# Patient Record
Sex: Female | Born: 1990 | Race: White | Hispanic: No | Marital: Single | Smoking: Never smoker
Health system: Southern US, Community
[De-identification: ages and names within clinical notes are randomized; demographics above are authoritative.]

## PROBLEM LIST (undated history)

## (undated) DIAGNOSIS — Z01419 Encounter for gynecological examination (general) (routine) without abnormal findings: Secondary | ICD-10-CM

## (undated) DIAGNOSIS — F909 Attention-deficit hyperactivity disorder, unspecified type: Secondary | ICD-10-CM

## (undated) DIAGNOSIS — I8390 Asymptomatic varicose veins of unspecified lower extremity: Secondary | ICD-10-CM

## (undated) HISTORY — DX: Attention-deficit hyperactivity disorder, unspecified type: F90.9

## (undated) HISTORY — DX: Encounter for gynecological examination (general) (routine) without abnormal findings: Z01.419

## (undated) HISTORY — PX: WISDOM TOOTH EXTRACTION: SHX21

## (undated) HISTORY — DX: Asymptomatic varicose veins of unspecified lower extremity: I83.90

---
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
## 2010-03-19 NOTE — Assessment & Plan Note (Signed)
Summary: check leg//ccm   Vital Signs:  Patient profile:   20 year old female Weight:      127 pounds BMI:     19.67 Temp:     98.2 degrees F oral BP sitting:   100 / 66  (left arm) Cuff size:   regular  Vitals Entered By: Raechel Ache, RN (Jun 28, 2009 11:10 AM) CC: Check vein L leg and c/o sometimes toes curl under on L foot.   History of Present Illness: Here with her mother to look at some painful varicose veins in her left lower leg. These came up about a year ago, and they have slowly enlarged since then. The right leg is not affected. Up until a year ago she played a lot of tennis almost daily, but she has done little exercising lately. 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 The left leg will swell a bit and get painful at times, especially if she is up on her feet all day. She gets frequent foot cramps in the left foot also, and not the right.   Allergies: No Known Drug Allergies  Past History:  Past Medical History: Reviewed history from 08/09/2008 and no changes required. Soft teeth ADHD, has had testing with Dr. Eliott Nine allergic diathesis Toe and leg cramps sees Dr. Edward Jolly for GYN exams  Past Surgical History: Reviewed history from 08/09/2008 and no changes required. No surgeries  Review of Systems  The patient denies anorexia, fever, weight loss, weight gain, vision loss, decreased hearing, hoarseness, chest pain, syncope, dyspnea on exertion, prolonged cough, headaches, hemoptysis, abdominal pain, melena, hematochezia, severe indigestion/heartburn, hematuria, incontinence, genital sores, muscle weakness, suspicious skin lesions, transient blindness, difficulty walking, depression, unusual weight change, abnormal bleeding, enlarged lymph nodes, angioedema, breast masses, and testicular masses.    Physical Exam  General:  Well-developed,well-nourished,in no acute distress; alert,appropriate and  cooperative throughout examination Pulses:  foot pulses are full and symmetrical Extremities:  no edema. The left lower leg has a number of varicosities  which are a little tender. Not red or warm. No calf tenderness, negative Homans, no cords felt.   Impression & Recommendations:  Problem # 1:  VARICOSE VEINS LOWER EXTREMITIES W/INFLAMMATION (ICD-454.1)  Orders: Misc. Referral (Misc. Ref)  Complete Medication List: 1)  Concerta 27 Mg Cr-tabs (Methylphenidate hcl) .... Once daily, may fill on 06-06-09 2)  Loestrin 24 Fe 1-20 Mg-mcg Tabs (Norethin ace-eth estrad-fe) .... Once daily 3)  Fluticasone Propionate 50 Mcg/act Susp (Fluticasone propionate) .... 2 sprays per nostril once daily  Patient Instructions: 1)  Advised her to wear support hose on the leg. We will refer her to see the [facility] soon for further evaluation.

## 2010-03-19 NOTE — Progress Notes (Signed)
Summary: concerta issues  Phone Note Call from Patient Call back at [phone]   Caller: Mom---voice mail Summary of Call: Wants to speak with Traci Simpson about the Concerta rx. Initial call taken by: Warnell Forester,  December 04, 2009 1:51 PM  Follow-up for Phone Call        states has to mail these rx in monthly cause [facility] wont [facility] her [facility] all in at one time. advised her since they are written montly call [facility] and see why they can't take rx at one time.  Follow-up by: Pura Spice, RN,  December 04, 2009 3:31 PM  Additional Follow-up for Phone Call Additional follow up Details #1::        mom called [facility] and mom wants you to return call for instructions. call mom @ [phone] or [phone]. Additional Follow-up by: Warnell Forester,  December 04, 2009 4:07 PM    Additional Follow-up for Phone Call Additional follow up Details #2::    mom called back and states for 90 day supply for stratrera will need PA and she will call and have form faxed over.  Follow-up by: Pura Spice, RN,  December 04, 2009 4:33 PM   Appended Document: concerta issues mom walked in and said [facility] will not fax over PA form but she wants 90 day supply of concerta. she has the other rx.   Appended Document: concerta issues  clarificataion above mess said on Oct 18 at 4:33pm stratretra but med is concerta....gh rn...........Marland Kitchen  Appended Document: concerta issues we got a PA form for Methyphenidate today. This was filled out and faxed back in

## 2010-03-19 NOTE — Letter (Signed)
Summary: Letter from mom regarding Rx.  Letter from mom regarding Rx.   Imported By: Maryln Gottron 03/09/2009 14:34:29  _____________________________________________________________________  External Attachment:    Type:   Image     Comment:   External Document

## 2010-03-19 NOTE — Letter (Signed)
Summary: [facility]  [facility]   Imported By: Maryln Gottron 10/03/2009 15:30:45  _____________________________________________________________________  External Attachment:    Type:   Image     Comment:   External Document

## 2010-03-19 NOTE — Letter (Signed)
Summary: [facility]  [facility]   Imported By: Maryln Gottron 08/13/2009 13:15:30  _____________________________________________________________________  External Attachment:    Type:   Image     Comment:   External Document

## 2010-03-19 NOTE — Progress Notes (Signed)
Summary: REFILL REQUEST (Adderall - PA approved?)  Phone Note Refill Request Message from:  Patient on December 06, 2009 4:22 PM  Refills Requested: Medication #1:  CONCERTA 27 MG CR-TABS once daily   Notes: 90-DAY SUPPLY (plus one refill) so [facility].... Pts mom can be reached at [phone] when Rx is ready for p/u ---- may not be able to p/u till Monday, 10/24.  90-DAY SUPPLY (plus one refill) so it can be sent [facility] by pts mom.... Pts mom can be reached at [phone] when Rx is ready for p/u. Original Rx's were left with [facility] per pts mom.   Initial call taken by: Debbra Riding,  December 06, 2009 4:24 PM  Follow-up for Phone Call        I left message on mom's voicemail yesterday stating that the prior auth was approved. where are the rxs? please call mom. Follow-up by: Warnell Forester,  December 07, 2009 8:07 AM  Additional Follow-up for Phone Call Additional follow up Details #1::        these are ready to be picked up  Additional Follow-up by: Nelwyn Salisbury MD,  December 07, 2009 8:54 AM    New/Updated Medications: CONCERTA 27 MG CR-TABS (METHYLPHENIDATE HCL) once daily CONCERTA 27 MG CR-TABS (METHYLPHENIDATE HCL) once daily, may fill on 03-08-10 Prescriptions: CONCERTA 27 MG CR-TABS (METHYLPHENIDATE HCL) once daily, may fill on 03-08-10  #90 x 0   Entered and Authorized by:   Nelwyn Salisbury MD   Signed by:   Nelwyn Salisbury MD on 12/07/2009   Method used:   Print then Give to Patient   RxID:   [id number] CONCERTA 27 MG CR-TABS (METHYLPHENIDATE HCL) once daily  #90 x 0   Entered and Authorized by:   Nelwyn Salisbury MD   Signed by:   Nelwyn Salisbury MD on 12/07/2009   Method used:   Print then Give to Patient   RxID:   [id number]

## 2010-03-19 NOTE — Medication Information (Signed)
Summary: Methylphenidate Approved  Methylphenidate Approved   Imported By: Maryln Gottron 12/10/2009 14:38:00  _____________________________________________________________________  External Attachment:    Type:   Image     Comment:   External Document

## 2010-05-05 LAB — DIFFERENTIAL
Basophils Absolute: 0 10*3/uL (ref 0.0–0.1)
Basophils Relative: 1 % (ref 0–1)
Lymphocytes Relative: 48 % — ABNORMAL HIGH (ref 12–46)
Neutro Abs: 2.6 10*3/uL (ref 1.7–7.7)
Neutrophils Relative %: 42 % — ABNORMAL LOW (ref 43–77)

## 2010-05-05 LAB — CBC
Hemoglobin: 14.4 g/dL (ref 12.0–15.0)
MCHC: 34.4 g/dL (ref 30.0–36.0)
Platelets: 215 10*3/uL (ref 150–400)
RDW: 11.9 % (ref 11.5–15.5)

## 2010-05-05 LAB — POCT INFECTIOUS MONO SCREEN: Mono Screen: NEGATIVE

## 2010-05-23 ENCOUNTER — Other Ambulatory Visit (INDEPENDENT_AMBULATORY_CARE_PROVIDER_SITE_OTHER): Payer: BC Managed Care – PPO | Admitting: Family Medicine

## 2010-05-23 DIAGNOSIS — Z0000 Encounter for general adult medical examination without abnormal findings: Secondary | ICD-10-CM

## 2010-05-23 LAB — HEPATIC FUNCTION PANEL
ALT: 15 U/L (ref 0–35)
AST: 18 U/L (ref 0–37)
Bilirubin, Direct: 0.3 mg/dL (ref 0.0–0.3)
Total Bilirubin: 2 mg/dL — ABNORMAL HIGH (ref 0.3–1.2)
Total Protein: 6.8 g/dL (ref 6.0–8.3)

## 2010-05-23 LAB — LIPID PANEL
HDL: 62.5 mg/dL (ref >39.00)
LDL Cholesterol: 99 mg/dL (ref 0–99)
Total CHOL/HDL Ratio: 3
VLDL: 14.8 mg/dL (ref 0.0–40.0)

## 2010-05-23 LAB — BASIC METABOLIC PANEL
BUN: 7 mg/dL (ref 6–23)
Chloride: 103 mEq/L (ref 96–112)
GFR: 135.87 mL/min (ref >60.00)
Potassium: 3.9 mEq/L (ref 3.5–5.1)

## 2010-05-23 LAB — CBC WITH DIFFERENTIAL/PLATELET
Basophils Relative: 0.6 % (ref 0.0–3.0)
Eosinophils Relative: 1.9 % (ref 0.0–5.0)
HCT: 40.2 % (ref 36.0–46.0)
Lymphs Abs: 2 10*3/uL (ref 0.7–4.0)
MCV: 92.7 fl (ref 78.0–100.0)
Monocytes Absolute: 0.4 10*3/uL (ref 0.1–1.0)
Monocytes Relative: 7.7 % (ref 3.0–12.0)
Neutrophils Relative %: 46.7 % (ref 43.0–77.0)
Platelets: 222 10*3/uL (ref 150.0–400.0)
RBC: 4.34 Mil/uL (ref 3.87–5.11)
WBC: 4.8 10*3/uL (ref 4.5–10.5)

## 2010-05-23 LAB — POCT URINALYSIS DIPSTICK
Spec Grav, UA: 1.02
Urobilinogen, UA: 1
pH, UA: 6

## 2010-05-25 LAB — POCT RAPID STREP A (OFFICE): Streptococcus, Group A Screen (Direct): NEGATIVE

## 2010-05-29 ENCOUNTER — Telehealth: Payer: Self-pay

## 2010-05-29 NOTE — Telephone Encounter (Signed)
Mess left that labs are normal

## 2010-05-29 NOTE — Telephone Encounter (Signed)
Message copied by Madison Hickman on Wed May 29, 2010  3:08 PM ------      Message from: Dwaine Deter      Created: Mon May 27, 2010 12:45 PM       normal

## 2010-06-20 ENCOUNTER — Encounter: Payer: Self-pay | Admitting: Family Medicine

## 2010-06-20 ENCOUNTER — Ambulatory Visit (INDEPENDENT_AMBULATORY_CARE_PROVIDER_SITE_OTHER): Payer: BC Managed Care – PPO | Admitting: Family Medicine

## 2010-06-20 VITALS — BP 92/62 | HR 76 | Temp 98.2°F | Resp 14 | Ht 67.0 in | Wt 123.0 lb

## 2010-06-20 DIAGNOSIS — Z0000 Encounter for general adult medical examination without abnormal findings: Secondary | ICD-10-CM

## 2010-06-20 MED ORDER — METHYLPHENIDATE HCL ER (OSM) 27 MG PO TBCR: 27.0000 mg | EXTENDED_RELEASE_TABLET | ORAL | Status: DC

## 2010-06-20 NOTE — Progress Notes (Signed)
  Subjective:    Patient ID: Traci Simpson, female    DOB: 03/05/1990, 20 y.o.   MRN: [id number]  HPI 20 yr old female for a cpx and for med refills. She feels fine and has no complaints. She is very pleased with Concerta and wants to stay on it. She will be taking summer classes and will need to take this through the summer. 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    Review of Systems  Constitutional: Negative.   HENT: Negative.   Eyes: Negative.   Respiratory: Negative.   Cardiovascular: Negative.   Gastrointestinal: Negative.   Genitourinary: Negative for dysuria, urgency, frequency, hematuria, flank pain, decreased urine volume, enuresis, difficulty urinating, pelvic pain and dyspareunia.  Musculoskeletal: Negative.   Skin: Negative.   Neurological: Negative.   Hematological: Negative.   Psychiatric/Behavioral: Negative.        Objective:   Physical Exam  Constitutional: She is oriented to person, place, and time. She appears well-developed and well-nourished. No distress.  HENT:  Head: Normocephalic and atraumatic.  Right Ear: External ear normal.  Left Ear: External ear normal.  Nose: Nose normal.  Mouth/Throat: Oropharynx is clear and moist. No oropharyngeal exudate.  Eyes: Conjunctivae and EOM are normal. Pupils are equal, round, and reactive to light. No scleral icterus.  Neck: Normal range of motion. Neck supple. No JVD present. No thyromegaly present.  Cardiovascular: Normal rate, regular rhythm, normal heart sounds and intact distal pulses.  Exam reveals no gallop and no friction rub.   No murmur heard. Pulmonary/Chest: Effort normal and breath sounds normal. No respiratory distress. She has no wheezes. She has no rales. She exhibits no tenderness.  Abdominal: Soft. Bowel sounds are normal. She exhibits no distension and no mass. There is no tenderness. There is no rebound and no guarding.  Musculoskeletal: Normal range of  motion. She exhibits no edema and no tenderness.  Lymphadenopathy:    She has no cervical adenopathy.  Neurological: She is alert and oriented to person, place, and time. She has normal reflexes. No cranial nerve deficit. She exhibits normal muscle tone. Coordination normal.  Skin: Skin is warm and dry. No rash noted. No erythema.  Psychiatric: She has a normal mood and affect. Her behavior is normal. Judgment and thought content normal.          Assessment & Plan:  Well exam. Refilled meds

## 2010-12-23 ENCOUNTER — Telehealth: Payer: Self-pay | Admitting: *Deleted

## 2010-12-23 NOTE — Telephone Encounter (Signed)
Needs rx for methylphenidate 27 mg sent to [facility].

## 2010-12-24 ENCOUNTER — Telehealth: Payer: Self-pay | Admitting: Family Medicine

## 2010-12-24 MED ORDER — METHYLPHENIDATE HCL ER (OSM) 27 MG PO TBCR: 27.0000 mg | EXTENDED_RELEASE_TABLET | ORAL | Status: DC

## 2010-12-24 NOTE — Telephone Encounter (Signed)
duplicate

## 2010-12-24 NOTE — Telephone Encounter (Signed)
Please fax this in

## 2010-12-25 NOTE — Telephone Encounter (Signed)
Script ready for pick up and left voice message for pt. 

## 2010-12-25 NOTE — Telephone Encounter (Signed)
Order was faxed and left voice message for pt.

## 2010-12-26 ENCOUNTER — Telehealth: Payer: Self-pay

## 2010-12-26 MED ORDER — METHYLPHENIDATE HCL ER (OSM) 27 MG PO TBCR: 27.0000 mg | EXTENDED_RELEASE_TABLET | ORAL | Status: DC

## 2010-12-26 NOTE — Telephone Encounter (Signed)
Pt would like to pick up rx.  Tim Lair will call to make pt aware.

## 2011-07-15 ENCOUNTER — Other Ambulatory Visit (INDEPENDENT_AMBULATORY_CARE_PROVIDER_SITE_OTHER): Payer: BC Managed Care – PPO

## 2011-07-15 DIAGNOSIS — Z0000 Encounter for general adult medical examination without abnormal findings: Secondary | ICD-10-CM

## 2011-07-15 LAB — POCT URINALYSIS DIPSTICK
Blood, UA: NEGATIVE
Glucose, UA: NEGATIVE
Ketones, UA: NEGATIVE
Protein, UA: NEGATIVE
Spec Grav, UA: 1.015

## 2011-07-15 LAB — CBC WITH DIFFERENTIAL/PLATELET
Basophils Relative: 0.6 % (ref 0.0–3.0)
Eosinophils Absolute: 0.1 10*3/uL (ref 0.0–0.7)
Hemoglobin: 13.4 g/dL (ref 12.0–15.0)
Lymphocytes Relative: 30.7 % (ref 12.0–46.0)
MCHC: 33.7 g/dL (ref 30.0–36.0)
Monocytes Relative: 8.1 % (ref 3.0–12.0)
Neutrophils Relative %: 59.4 % (ref 43.0–77.0)
RBC: 4.29 Mil/uL (ref 3.87–5.11)
WBC: 5.2 10*3/uL (ref 4.5–10.5)

## 2011-07-15 LAB — BASIC METABOLIC PANEL
Calcium: 9.1 mg/dL (ref 8.4–10.5)
Creatinine, Ser: 0.6 mg/dL (ref 0.4–1.2)
GFR: 136.96 mL/min (ref >60.00)
Sodium: 138 mEq/L (ref 135–145)

## 2011-07-15 LAB — LIPID PANEL
Cholesterol: 174 mg/dL (ref 0–200)
LDL Cholesterol: 80 mg/dL (ref 0–99)
Triglycerides: 93 mg/dL (ref 0.0–149.0)
VLDL: 18.6 mg/dL (ref 0.0–40.0)

## 2011-07-15 LAB — HEPATIC FUNCTION PANEL
AST: 19 U/L (ref 0–37)
Alkaline Phosphatase: 39 U/L (ref 39–117)
Bilirubin, Direct: 0.2 mg/dL (ref 0.0–0.3)
Total Protein: 7 g/dL (ref 6.0–8.3)

## 2011-07-16 NOTE — Progress Notes (Signed)
Quick Note:  I spoke with pt ______ 

## 2011-07-21 ENCOUNTER — Encounter: Payer: BC Managed Care – PPO | Admitting: Family Medicine

## 2011-08-05 ENCOUNTER — Ambulatory Visit (INDEPENDENT_AMBULATORY_CARE_PROVIDER_SITE_OTHER): Payer: BC Managed Care – PPO | Admitting: Family Medicine

## 2011-08-05 ENCOUNTER — Encounter: Payer: Self-pay | Admitting: Family Medicine

## 2011-08-05 VITALS — BP 100/66 | HR 81 | Temp 98.5°F | Ht 67.0 in | Wt 136.0 lb

## 2011-08-05 DIAGNOSIS — Z0000 Encounter for general adult medical examination without abnormal findings: Secondary | ICD-10-CM

## 2011-08-05 MED ORDER — METHYLPHENIDATE HCL ER (OSM) 27 MG PO TBCR: 27.0000 mg | EXTENDED_RELEASE_TABLET | ORAL | Status: DC

## 2011-08-06 ENCOUNTER — Encounter: Payer: Self-pay | Admitting: Family Medicine

## 2011-08-06 NOTE — Progress Notes (Signed)
  Subjective:    Patient ID: Traci Simpson, female    DOB: Nov 23, 1990, 20 y.o.   MRN: [id number]  HPI 21 yr old female for a cpx. She feels great and has no concerns. She will graduate from [employer] this December, and she has applied to dental school at [employer].    Review of Systems  Constitutional: Negative.   HENT: Negative.   Eyes: Negative.   Respiratory: Negative.   Cardiovascular: Negative.   Gastrointestinal: Negative.   Genitourinary: Negative for dysuria, urgency, frequency, hematuria, flank pain, decreased urine volume, enuresis, difficulty urinating, pelvic pain and dyspareunia.  Musculoskeletal: Negative.   Skin: Negative.   Neurological: Negative.   Hematological: Negative.   Psychiatric/Behavioral: Negative.        Objective:   Physical Exam  Constitutional: She is oriented to person, place, and time. She appears well-developed and well-nourished. No distress.  HENT:  Head: Normocephalic and atraumatic.  Right Ear: External ear normal.  Left Ear: External ear normal.  Nose: Nose normal.  Mouth/Throat: Oropharynx is clear and moist. No oropharyngeal exudate.  Eyes: Conjunctivae and EOM are normal. Pupils are equal, round, and reactive to light. No scleral icterus.  Neck: Normal range of motion. Neck supple. No JVD present. No thyromegaly present.  Cardiovascular: Normal rate, regular rhythm, normal heart sounds and intact distal pulses.  Exam reveals no gallop and no friction rub.   No murmur heard. Pulmonary/Chest: Effort normal and breath sounds normal. No respiratory distress. She has no wheezes. She has no rales. She exhibits no tenderness.  Abdominal: Soft. Bowel sounds are normal. She exhibits no distension and no mass. There is no tenderness. There is no rebound and no guarding.  Musculoskeletal: Normal range of motion. She exhibits no edema and no tenderness.  Lymphadenopathy:    She has no cervical adenopathy.  Neurological: She is alert and  oriented to person, place, and time. She has normal reflexes. No cranial nerve deficit. She exhibits normal muscle tone. Coordination normal.  Skin: Skin is warm and dry. No rash noted. No erythema.  Psychiatric: She has a normal mood and affect. Her behavior is normal. Judgment and thought content normal.          Assessment & Plan:  Well exam.

## 2011-12-29 ENCOUNTER — Telehealth: Payer: Self-pay | Admitting: Family Medicine

## 2011-12-29 MED ORDER — METHYLPHENIDATE HCL ER (OSM) 27 MG PO TBCR: 27.0000 mg | EXTENDED_RELEASE_TABLET | ORAL | Status: DC

## 2011-12-29 NOTE — Telephone Encounter (Signed)
We have NEVER written this for #90 with one refill because it is illegal to do so. I wrote for another #90 today

## 2011-12-29 NOTE — Telephone Encounter (Signed)
Mom calling to state pt has always received Concerta 27mg  # 90 with 1 refill.  However, the last script was written for #90 with no refills.  Requesting to have refill of concerta 27mg  # 90 with 1 refill.

## 2011-12-29 NOTE — Telephone Encounter (Signed)
Script is ready for pick up and I spoke with pt.  

## 2012-05-10 ENCOUNTER — Telehealth: Payer: Self-pay | Admitting: Family Medicine

## 2012-05-10 MED ORDER — METHYLPHENIDATE HCL ER (OSM) 27 MG PO TBCR: 27.0000 mg | EXTENDED_RELEASE_TABLET | ORAL | Status: DC

## 2012-05-10 NOTE — Telephone Encounter (Signed)
done

## 2012-05-10 NOTE — Telephone Encounter (Signed)
Script is ready for pick up and left voice message for pt. 

## 2012-05-10 NOTE — Telephone Encounter (Signed)
Pt needs new rx generic concerta 27 mg. Pt will pick up friday

## 2012-06-23 ENCOUNTER — Telehealth: Payer: Self-pay | Admitting: Family Medicine

## 2012-06-23 NOTE — Telephone Encounter (Signed)
Pt's mother calling to state she received letter from [facility] advising PA for Concertta will expire 07/17/12.  Mom is requesting new PA request be sent to insurance for approval.

## 2012-06-29 ENCOUNTER — Ambulatory Visit (INDEPENDENT_AMBULATORY_CARE_PROVIDER_SITE_OTHER): Payer: BC Managed Care – PPO | Admitting: Family Medicine

## 2012-06-29 DIAGNOSIS — Z111 Encounter for screening for respiratory tuberculosis: Secondary | ICD-10-CM

## 2012-07-01 ENCOUNTER — Telehealth: Payer: Self-pay | Admitting: Family Medicine

## 2012-07-01 ENCOUNTER — Ambulatory Visit (INDEPENDENT_AMBULATORY_CARE_PROVIDER_SITE_OTHER)
Admission: RE | Admit: 2012-07-01 | Discharge: 2012-07-01 | Disposition: A | Discharge status: Home / Self Care | Payer: BC Managed Care – PPO | Source: Ambulatory Visit | Attending: Family Medicine | Admitting: Family Medicine

## 2012-07-01 DIAGNOSIS — Z209 Contact with and (suspected) exposure to unspecified communicable disease: Secondary | ICD-10-CM

## 2012-07-01 NOTE — Telephone Encounter (Signed)
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 We will get a CXR

## 2012-07-02 ENCOUNTER — Ambulatory Visit (INDEPENDENT_AMBULATORY_CARE_PROVIDER_SITE_OTHER): Payer: BC Managed Care – PPO | Admitting: Family Medicine

## 2012-07-02 ENCOUNTER — Encounter: Payer: Self-pay | Admitting: Family Medicine

## 2012-07-02 VITALS — BP 102/60 | HR 119 | Temp 98.3°F | Wt 146.0 lb

## 2012-07-02 DIAGNOSIS — R7611 Nonspecific reaction to tuberculin skin test without active tuberculosis: Secondary | ICD-10-CM | POA: Insufficient documentation

## 2012-07-02 MED ORDER — METHYLPHENIDATE HCL ER (OSM) 27 MG PO TBCR: 27.0000 mg | EXTENDED_RELEASE_TABLET | ORAL | Status: DC

## 2012-07-02 NOTE — Telephone Encounter (Signed)
Prior auth submitted

## 2012-07-02 NOTE — Progress Notes (Signed)
  Subjective:    Patient ID: Traci Simpson, female    DOB: 06-Apr-1990, 22 y.o.   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 This read 15 mm of induration. She had a normal CXR yesterday. After they got home yesterday her mother went through her childhood medical information (she had been adopted from [place] as a baby), and she realized that she had received a BCG immunization as a baby. She will be attending [employer] dental school in August.    Review of Systems  Constitutional: Negative.   HENT: Negative.   Respiratory: Negative.        Objective:   Physical Exam  Constitutional: She appears well-developed and well-nourished.  Pulmonary/Chest: Effort normal and breath sounds normal.          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 Advised them to avoid any PPD tests in the future. We filled out her forms. No antibiotics are needed.

## 2012-07-02 NOTE — Progress Notes (Signed)
Quick Note:  Pt coming in today and will go over then. ______

## 2012-07-04 ENCOUNTER — Encounter: Payer: Self-pay | Admitting: Family Medicine

## 2012-07-29 ENCOUNTER — Other Ambulatory Visit: Payer: BC Managed Care – PPO

## 2012-08-05 ENCOUNTER — Encounter: Payer: BC Managed Care – PPO | Admitting: Family Medicine

## 2012-08-06 ENCOUNTER — Telehealth: Payer: Self-pay | Admitting: Family Medicine

## 2012-08-06 NOTE — Telephone Encounter (Signed)
Yes, this is okay.

## 2012-08-06 NOTE — Telephone Encounter (Signed)
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 Pt would like to come in June 30 or July 1. Is that ok to rsc her CPE that day?

## 2012-08-06 NOTE — Telephone Encounter (Signed)
Appt made/pt aware/kh

## 2012-08-16 ENCOUNTER — Ambulatory Visit (INDEPENDENT_AMBULATORY_CARE_PROVIDER_SITE_OTHER): Payer: BC Managed Care – PPO | Admitting: Family Medicine

## 2012-08-16 ENCOUNTER — Encounter: Payer: Self-pay | Admitting: Family Medicine

## 2012-08-16 VITALS — BP 100/60 | HR 89 | Temp 98.5°F | Ht 67.0 in | Wt 148.0 lb

## 2012-08-16 DIAGNOSIS — Z0000 Encounter for general adult medical examination without abnormal findings: Secondary | ICD-10-CM

## 2012-08-16 LAB — HEPATIC FUNCTION PANEL
AST: 20 U/L (ref 0–37)
Alkaline Phosphatase: 39 U/L (ref 39–117)
Bilirubin, Direct: 0.1 mg/dL (ref 0.0–0.3)
Total Bilirubin: 0.7 mg/dL (ref 0.3–1.2)

## 2012-08-16 LAB — LIPID PANEL
LDL Cholesterol: 90 mg/dL (ref 0–99)
VLDL: 19 mg/dL (ref 0.0–40.0)

## 2012-08-16 LAB — CBC WITH DIFFERENTIAL/PLATELET
Basophils Relative: 0.5 % (ref 0.0–3.0)
Eosinophils Absolute: 0.1 10*3/uL (ref 0.0–0.7)
Hemoglobin: 13.7 g/dL (ref 12.0–15.0)
Lymphocytes Relative: 35.5 % (ref 12.0–46.0)
MCHC: 33.9 g/dL (ref 30.0–36.0)
MCV: 93.1 fl (ref 78.0–100.0)
Neutro Abs: 2.9 10*3/uL (ref 1.4–7.7)
RBC: 4.36 Mil/uL (ref 3.87–5.11)

## 2012-08-16 LAB — BASIC METABOLIC PANEL
GFR: 130.43 mL/min (ref >60.00)
Potassium: 4.3 mEq/L (ref 3.5–5.1)
Sodium: 137 mEq/L (ref 135–145)

## 2012-08-16 LAB — POCT URINALYSIS DIPSTICK
Bilirubin, UA: NEGATIVE
Blood, UA: NEGATIVE
Ketones, UA: NEGATIVE
Leukocytes, UA: NEGATIVE
pH, UA: 5.5

## 2012-08-16 NOTE — Progress Notes (Signed)
  Subjective:    Patient ID: Traci Simpson, female    DOB: [birth year], 22 y.o.   MRN: [id number]  HPI 22 yr old female for a cpx. She feels good and has no concerns. 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    Review of Systems  Constitutional: Negative.   HENT: Negative.   Eyes: Negative.   Respiratory: Negative.   Cardiovascular: Negative.   Gastrointestinal: Negative.   Genitourinary: Negative for dysuria, urgency, frequency, hematuria, flank pain, decreased urine volume, enuresis, difficulty urinating, pelvic pain and dyspareunia.  Musculoskeletal: Negative.   Skin: Negative.   Neurological: Negative.   Psychiatric/Behavioral: Negative.        Objective:   Physical Exam  Constitutional: She is oriented to person, place, and time. She appears well-developed and well-nourished. No distress.  HENT:  Head: Normocephalic and atraumatic.  Right Ear: External ear normal.  Left Ear: External ear normal.  Nose: Nose normal.  Mouth/Throat: Oropharynx is clear and moist. No oropharyngeal exudate.  Eyes: Conjunctivae and EOM are normal. Pupils are equal, round, and reactive to light. No scleral icterus.  Neck: Normal range of motion. Neck supple. No JVD present. No thyromegaly present.  Cardiovascular: Normal rate, regular rhythm, normal heart sounds and intact distal pulses.  Exam reveals no gallop and no friction rub.   No murmur heard. Pulmonary/Chest: Effort normal and breath sounds normal. No respiratory distress. She has no wheezes. She has no rales. She exhibits no tenderness.  Abdominal: Soft. Bowel sounds are normal. She exhibits no distension and no mass. There is no tenderness. There is no rebound and no guarding.  Musculoskeletal: Normal range of motion. She exhibits no edema and no tenderness.  Lymphadenopathy:    She has no cervical adenopathy.  Neurological: She is alert and oriented to person, place, and time. She  has normal reflexes. No cranial nerve deficit. She exhibits normal muscle tone. Coordination normal.  Skin: Skin is warm and dry. No rash noted. No erythema.  Psychiatric: She has a normal mood and affect. Her behavior is normal. Judgment and thought content normal.          Assessment & Plan:  Well exam. Get fasting labs.

## 2012-08-17 NOTE — Progress Notes (Signed)
Quick Note:  I released results in my chart. ______ 

## 2012-09-13 ENCOUNTER — Other Ambulatory Visit: Payer: BC Managed Care – PPO

## 2012-09-15 ENCOUNTER — Encounter: Payer: Self-pay | Admitting: Family Medicine

## 2012-09-20 ENCOUNTER — Encounter: Payer: BC Managed Care – PPO | Admitting: Family Medicine

## 2012-10-08 ENCOUNTER — Telehealth: Payer: Self-pay | Admitting: Family Medicine

## 2012-10-08 NOTE — Telephone Encounter (Signed)
Rx last filled 07/02/12 and pt last seen 08/16/12

## 2012-10-08 NOTE — Telephone Encounter (Signed)
Pt mother called to request a 3 month refill of methylphenidate (CONCERTA) 27 MG CR tablet. Please assist.

## 2012-10-11 MED ORDER — METHYLPHENIDATE HCL ER (OSM) 27 MG PO TBCR: 27.0000 mg | EXTENDED_RELEASE_TABLET | ORAL | Status: DC

## 2012-10-11 NOTE — Telephone Encounter (Signed)
done

## 2012-10-11 NOTE — Telephone Encounter (Signed)
Script is ready for pick up and I left a voice message for pt. 

## 2012-11-08 ENCOUNTER — Encounter: Payer: Self-pay | Admitting: Family Medicine

## 2012-12-23 ENCOUNTER — Other Ambulatory Visit: Payer: Self-pay

## 2013-01-17 ENCOUNTER — Telehealth: Payer: Self-pay | Admitting: Family Medicine

## 2013-01-17 MED ORDER — METHYLPHENIDATE HCL ER (OSM) 27 MG PO TBCR: 27.0000 mg | EXTENDED_RELEASE_TABLET | ORAL | Status: DC

## 2013-01-17 NOTE — Telephone Encounter (Signed)
done

## 2013-01-17 NOTE — Telephone Encounter (Signed)
Pt needs generic concerta 27 mg #90

## 2013-01-18 NOTE — Telephone Encounter (Signed)
Script is ready for pick up and I left a voice message.  

## 2013-02-14 ENCOUNTER — Encounter: Payer: Self-pay | Admitting: Family Medicine

## 2013-02-14 ENCOUNTER — Ambulatory Visit (INDEPENDENT_AMBULATORY_CARE_PROVIDER_SITE_OTHER): Payer: BC Managed Care – PPO | Admitting: Family Medicine

## 2013-02-14 VITALS — BP 110/66 | HR 79 | Temp 97.8°F | Wt 154.0 lb

## 2013-02-14 DIAGNOSIS — F909 Attention-deficit hyperactivity disorder, unspecified type: Secondary | ICD-10-CM

## 2013-02-14 DIAGNOSIS — R635 Abnormal weight gain: Secondary | ICD-10-CM

## 2013-02-14 DIAGNOSIS — R5381 Other malaise: Secondary | ICD-10-CM

## 2013-02-14 LAB — CBC WITH DIFFERENTIAL/PLATELET
Basophils Relative: 1.1 % (ref 0.0–3.0)
Eosinophils Absolute: 0.4 10*3/uL (ref 0.0–0.7)
HCT: 40.5 % (ref 36.0–46.0)
Hemoglobin: 13.6 g/dL (ref 12.0–15.0)
Lymphs Abs: 1.8 10*3/uL (ref 0.7–4.0)
MCHC: 33.6 g/dL (ref 30.0–36.0)
MCV: 92.1 fl (ref 78.0–100.0)
Monocytes Absolute: 0.5 10*3/uL (ref 0.1–1.0)
Neutro Abs: 3.1 10*3/uL (ref 1.4–7.7)
Neutrophils Relative %: 53.3 % (ref 43.0–77.0)
RBC: 4.4 Mil/uL (ref 3.87–5.11)

## 2013-02-14 MED ORDER — METHYLPHENIDATE HCL ER (OSM) 36 MG PO TBCR: 36.0000 mg | EXTENDED_RELEASE_TABLET | Freq: Every day | ORAL | Status: DC

## 2013-02-14 NOTE — Progress Notes (Signed)
   Subjective:    Patient ID: Traci Simpson, female    DOB: 1990/08/14, 22 y.o.   MRN: [id number]  HPI Patient seen with concerns of weight gain and fatigue. She states she's gained about 20 pounds over the past 6 months. 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 She does exercise about 5 days per week. No dietary changes. No edema issues.  She is adopted so family history is unknown. She had TSH last summer which was normal  Patient has attention deficit disorder. She is requesting consideration of increasing her Concerta from 27 to 36 mg. She has some difficulty focusing with her current dosage. No history of any side effects. No insomnia issues.  Past Medical History  Diagnosis Date  . ADHD (attention deficit hyperactivity disorder)   . Gynecological examination     sees Dr. Edward Jolly  . Varicose veins    Past Surgical History  Procedure Laterality Date  . Varicose vein surgery  2011    left leg, per Dr. Ardyth Gal     reports that she has never smoked. She has never used smokeless tobacco. She reports that she drinks alcohol. She reports that she does not use illicit drugs. family history is not on file. No Known Allergies    Review of Systems  Constitutional: Positive for fatigue and unexpected weight change. Negative for appetite change.  Eyes: Negative for visual disturbance.  Respiratory: Negative for cough, chest tightness, shortness of breath and wheezing.   Cardiovascular: Negative for chest pain, palpitations and leg swelling.  Endocrine: Negative for polydipsia and polyuria.  Neurological: Negative for dizziness, seizures, syncope, weakness, light-headedness and headaches.       Objective:   Physical Exam  Constitutional: She appears well-developed and well-nourished.  Neck: Neck supple. No thyromegaly present.  Cardiovascular: Normal rate and regular rhythm.   No murmur heard. Pulmonary/Chest: Effort normal and breath sounds normal. No  respiratory distress. She has no wheezes. She has no rales.  Musculoskeletal: She exhibits no edema.          Assessment & Plan:  #1 weight gain and fatigue. She had TSH done last summer which is normal. She is requesting repeat. She does not have any other overt symptoms of hypothyroidism such as constipation, hair walls, or cold intolerance. We'll repeat TSH #2 attention deficit disorder. Increase Concerta to 36 mg once daily with new prescription written. She'll continue followup with primary regarding refills and ongoing followup

## 2013-02-14 NOTE — Progress Notes (Signed)
Pre visit review using our clinic review tool, if applicable. No additional management support is needed unless otherwise documented below in the visit note. 

## 2013-02-15 ENCOUNTER — Encounter: Payer: Self-pay | Admitting: Family Medicine

## 2013-02-15 ENCOUNTER — Telehealth: Payer: Self-pay | Admitting: Family Medicine

## 2013-02-15 NOTE — Telephone Encounter (Signed)
Update chart for flu vaccine

## 2013-05-16 ENCOUNTER — Telehealth: Payer: Self-pay | Admitting: Family Medicine

## 2013-05-16 MED ORDER — METHYLPHENIDATE HCL ER (OSM) 36 MG PO TBCR: 36.0000 mg | EXTENDED_RELEASE_TABLET | Freq: Every day | ORAL | Status: DC

## 2013-05-16 NOTE — Telephone Encounter (Signed)
Pt needs new rx methylphenidate 36 mg #90

## 2013-05-16 NOTE — Telephone Encounter (Signed)
done

## 2013-05-17 NOTE — Telephone Encounter (Signed)
Script is ready for pick up and I left a voice message.  

## 2013-06-29 ENCOUNTER — Telehealth: Payer: Self-pay | Admitting: Family Medicine

## 2013-06-29 NOTE — Telephone Encounter (Signed)
Per Dr. Fry okay to schedule 

## 2013-06-29 NOTE — Telephone Encounter (Addendum)
Pt needs the first appointment of the day on July 6 at 8:30 for her cpe and labs. Pt is in [place] in [place] and states she does this every year  Ok to schedule?.Marland Kitchen

## 2013-06-29 NOTE — Telephone Encounter (Signed)
Pt aware. appt scheduled.

## 2013-07-20 ENCOUNTER — Telehealth: Payer: Self-pay | Admitting: Family Medicine

## 2013-07-20 NOTE — Telephone Encounter (Signed)
Mom calling back to notify patient is in school at [employer] and needs script [facility] [address] [place], [place] tel #[phone]

## 2013-07-20 NOTE — Telephone Encounter (Signed)
Pt's mother calling on behalf of pt to request script for valium or an equilvelent due to patients anxiety of flying, pt will be flying to [place].  Please send to [facility].

## 2013-07-21 NOTE — Telephone Encounter (Signed)
Call in Valium 5 mg to take every 6 hours prn anxiety, #30 with no rf 

## 2013-07-22 MED ORDER — DIAZEPAM 5 MG PO TABS: 5.0000 mg | ORAL_TABLET | Freq: Four times a day (QID) | ORAL | Status: DC | PRN

## 2013-07-22 NOTE — Telephone Encounter (Signed)
Script was called in.

## 2013-08-22 ENCOUNTER — Ambulatory Visit (INDEPENDENT_AMBULATORY_CARE_PROVIDER_SITE_OTHER): Payer: BC Managed Care – PPO | Admitting: Family Medicine

## 2013-08-22 ENCOUNTER — Encounter: Payer: Self-pay | Admitting: Family Medicine

## 2013-08-22 VITALS — BP 91/62 | HR 68 | Temp 99.1°F | Ht 67.5 in | Wt 134.0 lb

## 2013-08-22 DIAGNOSIS — Z0000 Encounter for general adult medical examination without abnormal findings: Secondary | ICD-10-CM

## 2013-08-22 LAB — CBC WITH DIFFERENTIAL/PLATELET
BASOS PCT: 0.4 % (ref 0.0–3.0)
Basophils Absolute: 0 10*3/uL (ref 0.0–0.1)
EOS ABS: 0.2 10*3/uL (ref 0.0–0.7)
EOS PCT: 4.4 % (ref 0.0–5.0)
HCT: 40.6 % (ref 36.0–46.0)
Hemoglobin: 13.8 g/dL (ref 12.0–15.0)
LYMPHS PCT: 31.6 % (ref 12.0–46.0)
Lymphs Abs: 1.7 10*3/uL (ref 0.7–4.0)
MCHC: 33.9 g/dL (ref 30.0–36.0)
MCV: 92.9 fl (ref 78.0–100.0)
MONOS PCT: 8.4 % (ref 3.0–12.0)
Monocytes Absolute: 0.4 10*3/uL (ref 0.1–1.0)
NEUTROS PCT: 55.2 % (ref 43.0–77.0)
Neutro Abs: 2.9 10*3/uL (ref 1.4–7.7)
Platelets: 245 10*3/uL (ref 150.0–400.0)
RBC: 4.37 Mil/uL (ref 3.87–5.11)
RDW: 12.8 % (ref 11.5–15.5)
WBC: 5.2 10*3/uL (ref 4.0–10.5)

## 2013-08-22 LAB — POCT URINALYSIS DIPSTICK
BILIRUBIN UA: NEGATIVE
Blood, UA: NEGATIVE
GLUCOSE UA: NEGATIVE
KETONES UA: NEGATIVE
LEUKOCYTES UA: NEGATIVE
Nitrite, UA: NEGATIVE
Protein, UA: NEGATIVE
Spec Grav, UA: 1.02
Urobilinogen, UA: 0.2
pH, UA: 6.5

## 2013-08-22 LAB — HEPATIC FUNCTION PANEL
ALK PHOS: 37 U/L — AB (ref 39–117)
ALT: 15 U/L (ref 0–35)
AST: 23 U/L (ref 0–37)
Albumin: 4 g/dL (ref 3.5–5.2)
BILIRUBIN TOTAL: 1.1 mg/dL (ref 0.2–1.2)
Bilirubin, Direct: 0.1 mg/dL (ref 0.0–0.3)
Total Protein: 7.1 g/dL (ref 6.0–8.3)

## 2013-08-22 LAB — LIPID PANEL
CHOL/HDL RATIO: 3
CHOLESTEROL: 193 mg/dL (ref 0–200)
HDL: 72.6 mg/dL (ref >39.00)
LDL CALC: 105 mg/dL — AB (ref 0–99)
NonHDL: 120.4
Triglycerides: 79 mg/dL (ref 0.0–149.0)
VLDL: 15.8 mg/dL (ref 0.0–40.0)

## 2013-08-22 LAB — BASIC METABOLIC PANEL
BUN: 15 mg/dL (ref 6–23)
CALCIUM: 8.9 mg/dL (ref 8.4–10.5)
CO2: 23 mEq/L (ref 19–32)
Chloride: 106 mEq/L (ref 96–112)
Creatinine, Ser: 0.7 mg/dL (ref 0.4–1.2)
GFR: 110.26 mL/min (ref >60.00)
GLUCOSE: 88 mg/dL (ref 70–99)
Potassium: 4 mEq/L (ref 3.5–5.1)
SODIUM: 138 meq/L (ref 135–145)

## 2013-08-22 LAB — TSH: TSH: 0.7 u[IU]/mL (ref 0.35–4.50)

## 2013-08-22 MED ORDER — METHYLPHENIDATE HCL ER (OSM) 36 MG PO TBCR: 36.0000 mg | EXTENDED_RELEASE_TABLET | Freq: Every day | ORAL | Status: DC

## 2013-08-22 NOTE — Progress Notes (Signed)
   Subjective:    Patient ID: Traci Simpson, female    DOB: March 20, 1990, 23 y.o.   MRN: [id number]  HPI 23 yr old female for a cpx. She feels great. She is pleased with the new dose of Concerta. 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    Review of Systems  Constitutional: Negative.   HENT: Negative.   Eyes: Negative.   Respiratory: Negative.   Cardiovascular: Negative.   Gastrointestinal: Negative.   Genitourinary: Negative for dysuria, urgency, frequency, hematuria, flank pain, decreased urine volume, enuresis, difficulty urinating, pelvic pain and dyspareunia.  Musculoskeletal: Negative.   Skin: Negative.   Neurological: Negative.   Psychiatric/Behavioral: Negative.        Objective:   Physical Exam  Constitutional: She is oriented to person, place, and time. She appears well-developed and well-nourished. No distress.  HENT:  Head: Normocephalic and atraumatic.  Right Ear: External ear normal.  Left Ear: External ear normal.  Nose: Nose normal.  Mouth/Throat: Oropharynx is clear and moist. No oropharyngeal exudate.  Eyes: Conjunctivae and EOM are normal. Pupils are equal, round, and reactive to light. No scleral icterus.  Neck: Normal range of motion. Neck supple. No JVD present. No thyromegaly present.  Cardiovascular: Normal rate, regular rhythm, normal heart sounds and intact distal pulses.  Exam reveals no gallop and no friction rub.   No murmur heard. Pulmonary/Chest: Effort normal and breath sounds normal. No respiratory distress. She has no wheezes. She has no rales. She exhibits no tenderness.  Abdominal: Soft. Bowel sounds are normal. She exhibits no distension and no mass. There is no tenderness. There is no rebound and no guarding.  Musculoskeletal: Normal range of motion. She exhibits no edema and no tenderness.  Lymphadenopathy:    She has no cervical adenopathy.  Neurological: She is alert and oriented to person, place, and  time. She has normal reflexes. No cranial nerve deficit. She exhibits normal muscle tone. Coordination normal.  Skin: Skin is warm and dry. No rash noted. No erythema.  Psychiatric: She has a normal mood and affect. Her behavior is normal. Judgment and thought content normal.          Assessment & Plan:  Well exam. Get labs.

## 2013-08-22 NOTE — Progress Notes (Signed)
Pre visit review using our clinic review tool, if applicable. No additional management support is needed unless otherwise documented below in the visit note. 

## 2013-11-21 ENCOUNTER — Telehealth: Payer: Self-pay | Admitting: Family Medicine

## 2013-11-21 NOTE — Telephone Encounter (Signed)
Pt's mother calling to request refill of methylphenidate (CONCERTA) 36 MG PO CR tablet.

## 2013-11-22 MED ORDER — METHYLPHENIDATE HCL ER (OSM) 36 MG PO TBCR: 36.0000 mg | EXTENDED_RELEASE_TABLET | Freq: Every day | ORAL | Status: DC

## 2013-11-22 NOTE — Telephone Encounter (Signed)
done

## 2013-11-22 NOTE — Telephone Encounter (Signed)
Script is ready for pick up and I left a message. 

## 2014-01-17 ENCOUNTER — Encounter: Payer: Self-pay | Admitting: Family Medicine

## 2014-01-30 ENCOUNTER — Telehealth: Payer: Self-pay | Admitting: Family Medicine
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
## 2014-01-31 MED ORDER — METHYLPHENIDATE HCL ER (OSM) 36 MG PO TBCR: 36.0000 mg | EXTENDED_RELEASE_TABLET | Freq: Every day | ORAL | Status: DC

## 2014-01-31 NOTE — Telephone Encounter (Signed)
done

## 2014-01-31 NOTE — Telephone Encounter (Signed)
Script is ready for pick up and I left a message. 

## 2014-06-12 ENCOUNTER — Other Ambulatory Visit: Payer: Self-pay | Admitting: Family Medicine

## 2014-06-12 MED ORDER — METHYLPHENIDATE HCL ER (OSM) 36 MG PO TBCR: 36.0000 mg | EXTENDED_RELEASE_TABLET | Freq: Every day | ORAL | Status: DC

## 2014-06-12 NOTE — Telephone Encounter (Signed)
Called and spoke with pt and pt is aware.  

## 2014-06-12 NOTE — Telephone Encounter (Signed)
done

## 2014-06-12 NOTE — Telephone Encounter (Signed)
Needs a 3 month refill on her methylphenidate please. Call for pick up when ready please.

## 2014-08-23 ENCOUNTER — Telehealth: Payer: Self-pay | Admitting: Family Medicine

## 2014-08-23 NOTE — Telephone Encounter (Signed)
I spoke with pt's mom and pt will see Dr. Clent RidgesFry and then labs.

## 2014-08-23 NOTE — Telephone Encounter (Signed)
Either way is okay with me 

## 2014-08-23 NOTE — Telephone Encounter (Signed)
Pt would like to make sure it is ok for her to get her labs prio to her cpx on thurs am. 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 Is that OK?

## 2014-08-24 ENCOUNTER — Encounter: Payer: Self-pay | Admitting: Family Medicine

## 2014-08-24 ENCOUNTER — Other Ambulatory Visit: Payer: Self-pay

## 2014-08-24 ENCOUNTER — Ambulatory Visit (INDEPENDENT_AMBULATORY_CARE_PROVIDER_SITE_OTHER): Payer: BLUE CROSS/BLUE SHIELD | Admitting: Family Medicine

## 2014-08-24 VITALS — BP 96/67 | HR 66 | Temp 97.8°F | Ht 67.5 in | Wt 127.0 lb

## 2014-08-24 DIAGNOSIS — Z0000 Encounter for general adult medical examination without abnormal findings: Secondary | ICD-10-CM

## 2014-08-24 LAB — CBC WITH DIFFERENTIAL/PLATELET
BASOS PCT: 0.5 % (ref 0.0–3.0)
Basophils Absolute: 0 10*3/uL (ref 0.0–0.1)
EOS ABS: 0.1 10*3/uL (ref 0.0–0.7)
EOS PCT: 2.9 % (ref 0.0–5.0)
HEMATOCRIT: 40.1 % (ref 36.0–46.0)
Hemoglobin: 13.5 g/dL (ref 12.0–15.0)
Lymphocytes Relative: 37.5 % (ref 12.0–46.0)
Lymphs Abs: 1.8 10*3/uL (ref 0.7–4.0)
MCHC: 33.7 g/dL (ref 30.0–36.0)
MCV: 94.3 fl (ref 78.0–100.0)
MONO ABS: 0.3 10*3/uL (ref 0.1–1.0)
Monocytes Relative: 6.3 % (ref 3.0–12.0)
Neutro Abs: 2.6 10*3/uL (ref 1.4–7.7)
Neutrophils Relative %: 52.8 % (ref 43.0–77.0)
PLATELETS: 215 10*3/uL (ref 150.0–400.0)
RBC: 4.25 Mil/uL (ref 3.87–5.11)
RDW: 12.7 % (ref 11.5–15.5)
WBC: 4.9 10*3/uL (ref 4.0–10.5)

## 2014-08-24 LAB — HEPATIC FUNCTION PANEL
ALT: 13 U/L (ref 0–35)
AST: 18 U/L (ref 0–37)
Albumin: 3.9 g/dL (ref 3.5–5.2)
Alkaline Phosphatase: 37 U/L — ABNORMAL LOW (ref 39–117)
Bilirubin, Direct: 0.2 mg/dL (ref 0.0–0.3)
TOTAL PROTEIN: 6.6 g/dL (ref 6.0–8.3)
Total Bilirubin: 1.3 mg/dL — ABNORMAL HIGH (ref 0.2–1.2)

## 2014-08-24 LAB — LIPID PANEL
Cholesterol: 165 mg/dL (ref 0–200)
HDL: 71 mg/dL (ref >39.00)
LDL Cholesterol: 78 mg/dL (ref 0–99)
NONHDL: 94
Total CHOL/HDL Ratio: 2
Triglycerides: 79 mg/dL (ref 0.0–149.0)
VLDL: 15.8 mg/dL (ref 0.0–40.0)

## 2014-08-24 LAB — BASIC METABOLIC PANEL
BUN: 13 mg/dL (ref 6–23)
CO2: 28 meq/L (ref 19–32)
Calcium: 9.1 mg/dL (ref 8.4–10.5)
Chloride: 104 mEq/L (ref 96–112)
Creatinine, Ser: 0.65 mg/dL (ref 0.40–1.20)
GFR: 119.06 mL/min (ref >60.00)
Glucose, Bld: 73 mg/dL (ref 70–99)
Potassium: 4 mEq/L (ref 3.5–5.1)
SODIUM: 139 meq/L (ref 135–145)

## 2014-08-24 LAB — TSH: TSH: 0.95 u[IU]/mL (ref 0.35–4.50)

## 2014-08-24 MED ORDER — DIAZEPAM 5 MG PO TABS: 5.0000 mg | ORAL_TABLET | Freq: Four times a day (QID) | ORAL | Status: DC | PRN

## 2014-08-24 MED ORDER — METHYLPHENIDATE HCL ER (OSM) 36 MG PO TBCR: 36.0000 mg | EXTENDED_RELEASE_TABLET | Freq: Every day | ORAL | Status: DC

## 2014-08-24 NOTE — Telephone Encounter (Signed)
Mom called back b/c she did not understand why pt could not get labs done first. Advised mom once pt saw dr fry it would not take long to get her labs done. Mom was concerned  For pt's time limitations b/c pt needs to return to school once her cpe was done. Mom verbalized understanding

## 2014-08-24 NOTE — Progress Notes (Signed)
   Subjective:    Patient ID: Traci Simpson, female    DOB: 1990-08-16, 24 y.o.   MRN: [id number]  HPI 24 yr old female for a cpx. She feels well and has no concerns. Her Concerta still works well for her. 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   Review of Systems  Constitutional: Negative.   HENT: Negative.   Eyes: Negative.   Respiratory: Negative.   Cardiovascular: Negative.   Gastrointestinal: Negative.   Genitourinary: Negative for dysuria, urgency, frequency, hematuria, flank pain, decreased urine volume, enuresis, difficulty urinating, pelvic pain and dyspareunia.  Musculoskeletal: Negative.   Skin: Negative.   Neurological: Negative.   Psychiatric/Behavioral: Negative.        Objective:   Physical Exam  Constitutional: She is oriented to person, place, and time. She appears well-developed and well-nourished. No distress.  HENT:  Head: Normocephalic and atraumatic.  Right Ear: External ear normal.  Left Ear: External ear normal.  Nose: Nose normal.  Mouth/Throat: Oropharynx is clear and moist. No oropharyngeal exudate.  Eyes: Conjunctivae and EOM are normal. Pupils are equal, round, and reactive to light. No scleral icterus.  Neck: Normal range of motion. Neck supple. No JVD present. No thyromegaly present.  Cardiovascular: Normal rate, regular rhythm, normal heart sounds and intact distal pulses.  Exam reveals no gallop and no friction rub.   No murmur heard. Pulmonary/Chest: Effort normal and breath sounds normal. No respiratory distress. She has no wheezes. She has no rales. She exhibits no tenderness.  Abdominal: Soft. Bowel sounds are normal. She exhibits no distension and no mass. There is no tenderness. There is no rebound and no guarding.  Musculoskeletal: Normal range of motion. She exhibits no edema or tenderness.  Lymphadenopathy:    She has no cervical adenopathy.  Neurological: She is alert and oriented to person, place, and time. She  has normal reflexes. No cranial nerve deficit. She exhibits normal muscle tone. Coordination normal.  Skin: Skin is warm and dry. No rash noted. No erythema.  Psychiatric: She has a normal mood and affect. Her behavior is normal. Judgment and thought content normal.          Assessment & Plan:  Well exam. Get fasting labs. Refilled Concerta. We reviewed diet and exercise advice.

## 2014-08-24 NOTE — Progress Notes (Signed)
Pre visit review using our clinic review tool, if applicable. No additional management support is needed unless otherwise documented below in the visit note. 

## 2014-08-29 ENCOUNTER — Telehealth: Payer: Self-pay | Admitting: Family Medicine

## 2014-08-29 NOTE — Telephone Encounter (Signed)
Pt requesting results of lab work done last week.  Would also like to released to mychart.

## 2014-08-31 NOTE — Telephone Encounter (Signed)
I released results in my chart today.

## 2014-08-31 NOTE — Telephone Encounter (Signed)
These were read today

## 2014-12-04 IMAGING — CR DG CHEST 2V
2 series · 2 of 2 positions shown · non-contrast
Comparison: None

CLINICAL DATA: Positive PPD

CHEST - 2 VIEW

[view not recorded (1 of 2)]
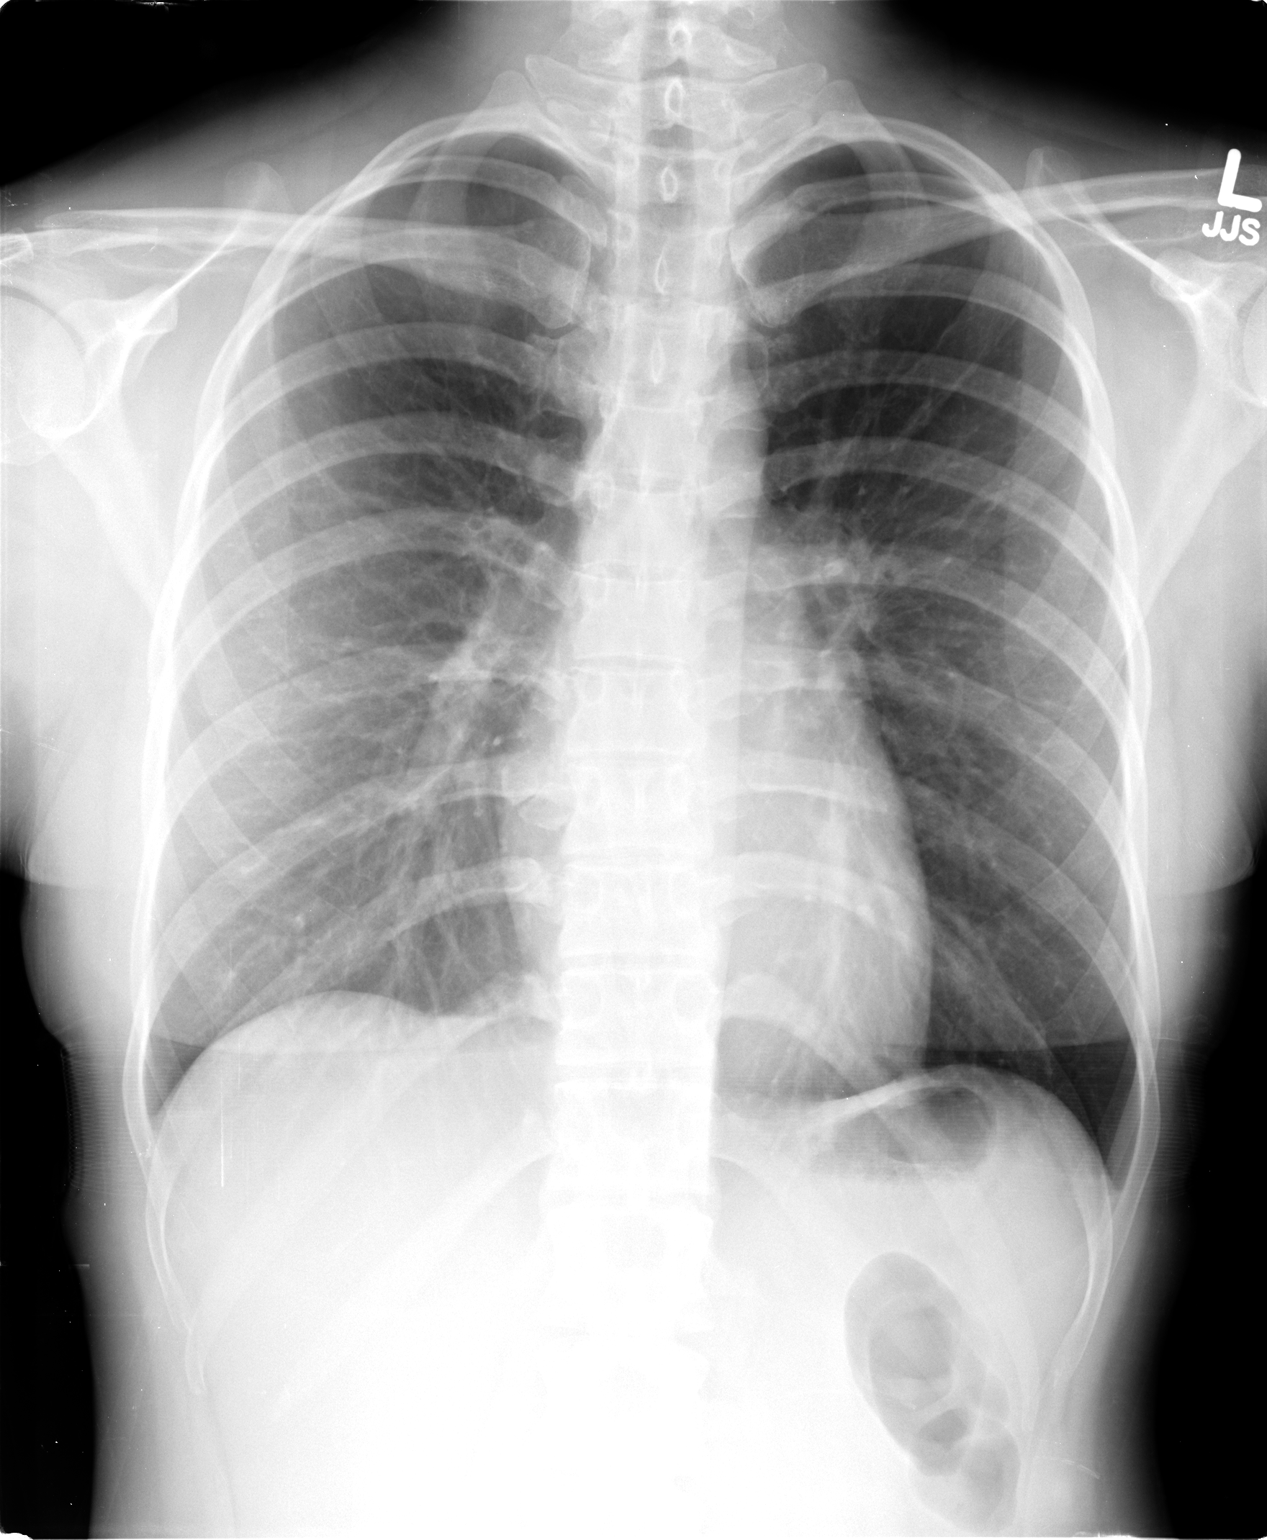

[view not recorded (2 of 2)]
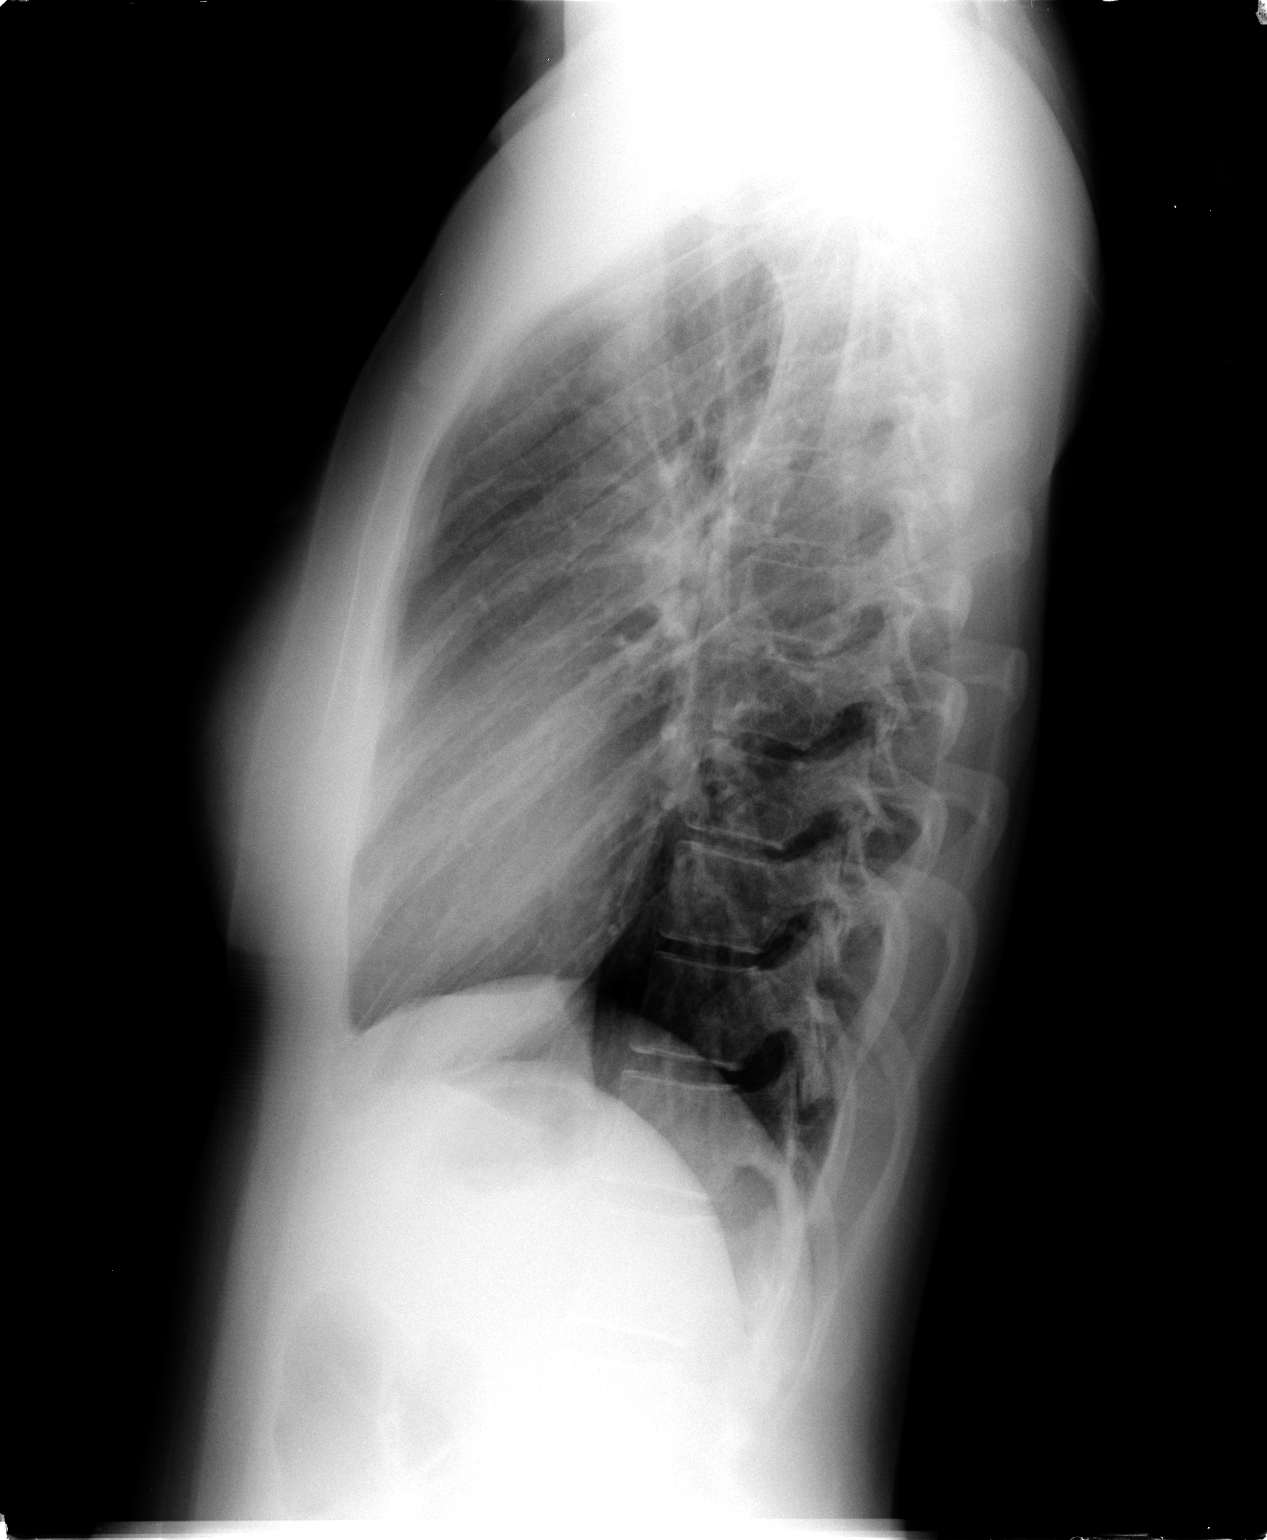

[2 of 2 positions shown; findings below may reference images not displayed]

FINDINGS: Cardiomediastinal silhouette is unremarkable.  No acute
infiltrate or pleural effusion.  No pulmonary edema.  Bony thorax
is unremarkable. No adenopathy.
IMPRESSION: No active disease. No adenopathy.

## 2014-12-27 ENCOUNTER — Telehealth: Payer: Self-pay | Admitting: Family Medicine

## 2014-12-27 NOTE — Telephone Encounter (Signed)
Voicemail received from patient's mother(Ilene Hulsebus) calling to report that patient has changed insurance plans effective October 2016.  Pt is due for a 3 month refill of methylphenidate (CONCERTA) 36 MG PO CR tablet.  However, with the patient's new plan she is required to get the branded medication only and not the generic as it does not require a prior authorization and the cost to the patient is less.  Mom is requesting a new rx for Concerta 36 mg marked dispensed as written so the pharmacy does not switch to the generic.  Please call mom when rx is ready for pick up.

## 2014-12-28 MED ORDER — CONCERTA 36 MG PO TBCR: 36.0000 mg | EXTENDED_RELEASE_TABLET | Freq: Every day | ORAL | Status: DC

## 2014-12-28 NOTE — Telephone Encounter (Signed)
done

## 2014-12-29 NOTE — Telephone Encounter (Signed)
Script is ready for pick up and I spoke with pt.  

## 2015-05-04 ENCOUNTER — Telehealth: Payer: Self-pay | Admitting: Family Medicine

## 2015-05-04 NOTE — Telephone Encounter (Signed)
Pt request refill of the following: CONCERTA 36 MG CR tablet   Phamacy:

## 2015-05-07 MED ORDER — CONCERTA 36 MG PO TBCR: 36.0000 mg | EXTENDED_RELEASE_TABLET | Freq: Every day | ORAL | Status: DC

## 2015-05-07 NOTE — Telephone Encounter (Signed)
Script is ready for pick up and I left a voice message for pt. 

## 2015-05-07 NOTE — Telephone Encounter (Signed)
done

## 2015-08-24 ENCOUNTER — Encounter: Payer: Self-pay | Admitting: Family Medicine

## 2015-08-24 ENCOUNTER — Ambulatory Visit (INDEPENDENT_AMBULATORY_CARE_PROVIDER_SITE_OTHER): Payer: BLUE CROSS/BLUE SHIELD | Admitting: Family Medicine

## 2015-08-24 VITALS — BP 92/62 | HR 67 | Temp 98.0°F | Ht 67.0 in | Wt 139.5 lb

## 2015-08-24 DIAGNOSIS — Z0000 Encounter for general adult medical examination without abnormal findings: Secondary | ICD-10-CM | POA: Diagnosis not present

## 2015-08-24 DIAGNOSIS — E507 Other ocular manifestations of vitamin A deficiency: Secondary | ICD-10-CM | POA: Diagnosis not present

## 2015-08-24 LAB — CBC WITH DIFFERENTIAL/PLATELET
BASOS ABS: 0 10*3/uL (ref 0.0–0.1)
BASOS PCT: 0.6 % (ref 0.0–3.0)
Eosinophils Absolute: 0.2 10*3/uL (ref 0.0–0.7)
Eosinophils Relative: 4 % (ref 0.0–5.0)
HEMATOCRIT: 41.5 % (ref 36.0–46.0)
Hemoglobin: 14.2 g/dL (ref 12.0–15.0)
LYMPHS ABS: 1.7 10*3/uL (ref 0.7–4.0)
LYMPHS PCT: 29.7 % (ref 12.0–46.0)
MCHC: 34.2 g/dL (ref 30.0–36.0)
MCV: 93.7 fl (ref 78.0–100.0)
MONOS PCT: 7.2 % (ref 3.0–12.0)
Monocytes Absolute: 0.4 10*3/uL (ref 0.1–1.0)
NEUTROS ABS: 3.4 10*3/uL (ref 1.4–7.7)
NEUTROS PCT: 58.5 % (ref 43.0–77.0)
PLATELETS: 247 10*3/uL (ref 150.0–400.0)
RBC: 4.43 Mil/uL (ref 3.87–5.11)
RDW: 12.6 % (ref 11.5–15.5)
WBC: 5.8 10*3/uL (ref 4.0–10.5)

## 2015-08-24 LAB — POC URINALSYSI DIPSTICK (AUTOMATED)
BILIRUBIN UA: NEGATIVE
GLUCOSE UA: NEGATIVE
KETONES UA: NEGATIVE
LEUKOCYTES UA: NEGATIVE
Nitrite, UA: NEGATIVE
PH UA: 6.5
Protein, UA: NEGATIVE
Spec Grav, UA: 1.025
Urobilinogen, UA: 1

## 2015-08-24 LAB — HEPATIC FUNCTION PANEL
ALBUMIN: 4.7 g/dL (ref 3.5–5.2)
ALK PHOS: 42 U/L (ref 39–117)
ALT: 14 U/L (ref 0–35)
AST: 22 U/L (ref 0–37)
Bilirubin, Direct: 0.3 mg/dL (ref 0.0–0.3)
Total Bilirubin: 1.5 mg/dL — ABNORMAL HIGH (ref 0.2–1.2)
Total Protein: 7.1 g/dL (ref 6.0–8.3)

## 2015-08-24 LAB — BASIC METABOLIC PANEL
BUN: 13 mg/dL (ref 6–23)
CO2: 30 mEq/L (ref 19–32)
CREATININE: 0.69 mg/dL (ref 0.40–1.20)
Calcium: 9.3 mg/dL (ref 8.4–10.5)
Chloride: 105 mEq/L (ref 96–112)
GFR: 110.22 mL/min (ref >60.00)
Glucose, Bld: 82 mg/dL (ref 70–99)
Potassium: 4.6 mEq/L (ref 3.5–5.1)
Sodium: 140 mEq/L (ref 135–145)

## 2015-08-24 LAB — LIPID PANEL
CHOL/HDL RATIO: 2
Cholesterol: 177 mg/dL (ref 0–200)
HDL: 79.7 mg/dL (ref >39.00)
LDL CALC: 83 mg/dL (ref 0–99)
NonHDL: 97.61
TRIGLYCERIDES: 73 mg/dL (ref 0.0–149.0)
VLDL: 14.6 mg/dL (ref 0.0–40.0)

## 2015-08-24 LAB — TSH: TSH: 0.84 u[IU]/mL (ref 0.35–4.50)

## 2015-08-24 LAB — SEDIMENTATION RATE: Sed Rate: 1 mm/hr (ref 0–20)

## 2015-08-24 MED ORDER — CONCERTA 36 MG PO TBCR: 36.0000 mg | EXTENDED_RELEASE_TABLET | Freq: Every day | ORAL | Status: DC

## 2015-08-24 MED ORDER — DIAZEPAM 5 MG PO TABS: 5.0000 mg | ORAL_TABLET | Freq: Four times a day (QID) | ORAL | Status: AC | PRN

## 2015-08-24 NOTE — Progress Notes (Signed)
Pre visit review using our clinic review tool, if applicable. No additional management support is needed unless otherwise documented below in the visit note. 

## 2015-08-24 NOTE — Progress Notes (Signed)
   Subjective:    Patient ID: Traci LloydKristen Simpson, female    DOB: [birth year], 25 y.o.   MRN: [id number]  HPI 25 yr old female for a well exam. 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 She feels well but does ask if she could have Sjogren's syndrome. For the past 3 years she has dealt with constant dryness in the eyes and in the mouth. She wears contacts. She has not had to use eye drops however. Her ADHD is stable.    Review of Systems  Constitutional: Negative.   HENT: Negative.   Eyes: Negative.   Respiratory: Negative.   Cardiovascular: Negative.   Gastrointestinal: Negative.   Genitourinary: Negative for dysuria, urgency, frequency, hematuria, flank pain, decreased urine volume, enuresis, difficulty urinating, pelvic pain and dyspareunia.  Musculoskeletal: Negative.   Skin: Negative.   Neurological: Negative.   Psychiatric/Behavioral: Negative.        Objective:   Physical Exam  Constitutional: She is oriented to person, place, and time. She appears well-developed and well-nourished. No distress.  HENT:  Head: Normocephalic and atraumatic.  Right Ear: External ear normal.  Left Ear: External ear normal.  Nose: Nose normal.  Mouth/Throat: Oropharynx is clear and moist. No oropharyngeal exudate.  Eyes: Conjunctivae and EOM are normal. Pupils are equal, round, and reactive to light. No scleral icterus.  Neck: Normal range of motion. Neck supple. No JVD present. No thyromegaly present.  Cardiovascular: Normal rate, regular rhythm, normal heart sounds and intact distal pulses.  Exam reveals no gallop and no friction rub.   No murmur heard. Pulmonary/Chest: Effort normal and breath sounds normal. No respiratory distress. She has no wheezes. She has no rales. She exhibits no tenderness.  Abdominal: Soft. Bowel sounds are normal. She exhibits no distension and no mass. There is no tenderness. There is no rebound and no guarding.  Musculoskeletal: Normal range of  motion. She exhibits no edema or tenderness.  Lymphadenopathy:    She has no cervical adenopathy.  Neurological: She is alert and oriented to person, place, and time. She has normal reflexes. No cranial nerve deficit. She exhibits normal muscle tone. Coordination normal.  Skin: Skin is warm and dry. No rash noted. No erythema.  Psychiatric: She has a normal mood and affect. Her behavior is normal. Judgment and thought content normal.          Assessment & Plan:  Well exam. We discussed diet and exercise. Get fasting labs including Sjogren's antibodies.  Nelwyn SalisburyFRY,Mariaelena Cade A, MD

## 2015-08-27 ENCOUNTER — Telehealth: Payer: Self-pay | Admitting: Family Medicine

## 2015-08-27 LAB — SJOGREN'S SYNDROME ANTIBODS(SSA + SSB)
SSA (RO) (ENA) ANTIBODY, IGG: NEGATIVE
SSB (La) (ENA) Antibody, IgG: <1

## 2015-08-27 NOTE — Telephone Encounter (Signed)
I had Dr. Fabian SharpPanosh interpret the lab results and informed pt of the results. Pt verbalized understanding. I did inform her that we don't have the Sjogren's syndrome lab test back yet. I did let the pt know I will call her once those results come in.

## 2015-08-27 NOTE — Telephone Encounter (Signed)
Pts mother would like to have the lab results.

## 2015-08-27 NOTE — Telephone Encounter (Signed)
Pt is calling requesting blood work results

## 2015-08-27 NOTE — Telephone Encounter (Signed)
Called pt to let her know that I will have another provider interpret her lab results and I will call her back.

## 2015-08-28 NOTE — Telephone Encounter (Signed)
Informed pt her Sjogren's syndrome lab result was normal. Pt verbalized understanding.

## 2015-08-28 NOTE — Telephone Encounter (Signed)
Called pt to inform her the Sjogren's syndrome lab result was normal. Left a voicemail for her to call the office back.

## 2015-12-17 ENCOUNTER — Telehealth: Payer: Self-pay | Admitting: Family Medicine

## 2015-12-17 MED ORDER — CONCERTA 36 MG PO TBCR: 36.0000 mg | EXTENDED_RELEASE_TABLET | Freq: Every day | ORAL | 0 refills | Status: DC

## 2015-12-17 NOTE — Telephone Encounter (Signed)
° ° °   °  Pt request refill of the following:   CONCERTA 36 MG CR tablet   Pt said remember she cant have the generic per her insurance    Phamacy:

## 2015-12-17 NOTE — Telephone Encounter (Signed)
done

## 2015-12-18 NOTE — Telephone Encounter (Signed)
Script is ready for pick up here at front office and I left a voice message for pt with this information.  

## 2016-01-14 ENCOUNTER — Emergency Department (HOSPITAL_COMMUNITY)
Admission: EM | Admit: 2016-01-14 | Discharge: 2016-01-14 | Disposition: A | Discharge status: Home / Self Care | Payer: BLUE CROSS/BLUE SHIELD | Attending: Emergency Medicine | Admitting: Emergency Medicine

## 2016-01-14 ENCOUNTER — Encounter (HOSPITAL_COMMUNITY): Payer: Self-pay | Admitting: *Deleted

## 2016-01-14 ENCOUNTER — Emergency Department (HOSPITAL_COMMUNITY): Payer: BLUE CROSS/BLUE SHIELD

## 2016-01-14 DIAGNOSIS — S42022A Displaced fracture of shaft of left clavicle, initial encounter for closed fracture: Secondary | ICD-10-CM | POA: Insufficient documentation

## 2016-01-14 DIAGNOSIS — Y939 Activity, unspecified: Secondary | ICD-10-CM | POA: Diagnosis not present

## 2016-01-14 DIAGNOSIS — F909 Attention-deficit hyperactivity disorder, unspecified type: Secondary | ICD-10-CM | POA: Diagnosis not present

## 2016-01-14 DIAGNOSIS — S42001S Fracture of unspecified part of right clavicle, sequela: Secondary | ICD-10-CM

## 2016-01-14 DIAGNOSIS — Y999 Unspecified external cause status: Secondary | ICD-10-CM | POA: Diagnosis not present

## 2016-01-14 DIAGNOSIS — S4992XA Unspecified injury of left shoulder and upper arm, initial encounter: Secondary | ICD-10-CM | POA: Diagnosis present

## 2016-01-14 DIAGNOSIS — Y92410 Unspecified street and highway as the place of occurrence of the external cause: Secondary | ICD-10-CM | POA: Diagnosis not present

## 2016-01-14 MED ORDER — OXYCODONE-ACETAMINOPHEN 5-325 MG PO TABS: 1.0000 | ORAL_TABLET | Freq: Four times a day (QID) | ORAL | 0 refills | Status: DC | PRN

## 2016-01-14 MED ORDER — CYCLOBENZAPRINE HCL 10 MG PO TABS: 10.0000 mg | ORAL_TABLET | Freq: Two times a day (BID) | ORAL | 0 refills | Status: DC | PRN

## 2016-01-14 MED ORDER — CYCLOBENZAPRINE HCL 10 MG PO TABS
10.0000 mg | ORAL_TABLET | Freq: Once | ORAL | Status: AC
  Administered 2016-01-14: 10 mg via ORAL
  Filled 2016-01-14: qty 1
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
## 2016-01-14 NOTE — ED Notes (Signed)
Pt has take 1 Hydrocodone. Prescribed yesterday. PA aware. New order for muscle relaxer placed.

## 2016-01-14 NOTE — Discharge Instructions (Signed)
Please call Dr. Ave Filterhandler tomorrow to request close follow up and surgical repair of your broken left clavicle.  Take muscle relaxant and pain medication as prescribed but avoid driving or operate heavy machinery as these medications can cause drowsiness.

## 2016-01-14 NOTE — ED Notes (Signed)
Ortho tech at bedside 
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
## 2016-04-04 ENCOUNTER — Other Ambulatory Visit: Payer: Self-pay | Admitting: Family Medicine

## 2016-04-11 MED ORDER — CONCERTA 36 MG PO TBCR: 36.0000 mg | EXTENDED_RELEASE_TABLET | Freq: Every day | ORAL | 0 refills | Status: DC

## 2016-04-11 NOTE — Telephone Encounter (Signed)
done

## 2016-04-11 NOTE — Telephone Encounter (Signed)
Patient notified that Rx is ready to be picked up 

## 2016-06-23 ENCOUNTER — Encounter: Payer: BLUE CROSS/BLUE SHIELD | Admitting: Family Medicine

## 2016-07-02 ENCOUNTER — Ambulatory Visit (INDEPENDENT_AMBULATORY_CARE_PROVIDER_SITE_OTHER): Payer: BLUE CROSS/BLUE SHIELD | Admitting: Family Medicine

## 2016-07-02 ENCOUNTER — Other Ambulatory Visit: Payer: Self-pay | Admitting: Family Medicine

## 2016-07-02 ENCOUNTER — Encounter: Payer: Self-pay | Admitting: Family Medicine

## 2016-07-02 VITALS — BP 96/70 | HR 94 | Temp 98.9°F | Ht 67.0 in | Wt 137.0 lb

## 2016-07-02 DIAGNOSIS — Z0000 Encounter for general adult medical examination without abnormal findings: Secondary | ICD-10-CM

## 2016-07-02 DIAGNOSIS — Z209 Contact with and (suspected) exposure to unspecified communicable disease: Secondary | ICD-10-CM

## 2016-07-02 LAB — CBC WITH DIFFERENTIAL/PLATELET
BASOS PCT: 0 %
Basophils Absolute: 0 cells/uL (ref 0–200)
EOS PCT: 2 %
Eosinophils Absolute: 90 cells/uL (ref 15–500)
HEMATOCRIT: 42.4 % (ref 35.0–45.0)
HEMOGLOBIN: 14.1 g/dL (ref 11.7–15.5)
LYMPHS ABS: 1485 {cells}/uL (ref 850–3900)
Lymphocytes Relative: 33 %
MCH: 31.4 pg (ref 27.0–33.0)
MCHC: 33.3 g/dL (ref 32.0–36.0)
MCV: 94.4 fL (ref 80.0–100.0)
MONO ABS: 360 {cells}/uL (ref 200–950)
MPV: 8.9 fL (ref 7.5–12.5)
Monocytes Relative: 8 %
NEUTROS PCT: 57 %
Neutro Abs: 2565 cells/uL (ref 1500–7800)
Platelets: 308 10*3/uL (ref 140–400)
RBC: 4.49 MIL/uL (ref 3.80–5.10)
RDW: 12.3 % (ref 11.0–15.0)
WBC: 4.5 10*3/uL (ref 3.8–10.8)

## 2016-07-02 LAB — BASIC METABOLIC PANEL
BUN: 7 mg/dL (ref 7–25)
CALCIUM: 9 mg/dL (ref 8.6–10.2)
CO2: 23 mmol/L (ref 20–31)
Chloride: 109 mmol/L (ref 98–110)
Creat: 0.7 mg/dL (ref 0.50–1.10)
GLUCOSE: 83 mg/dL (ref 65–99)
POTASSIUM: 4.4 mmol/L (ref 3.5–5.3)
SODIUM: 143 mmol/L (ref 135–146)

## 2016-07-02 LAB — POC URINALSYSI DIPSTICK (AUTOMATED)
Bilirubin, UA: NEGATIVE
Glucose, UA: NEGATIVE
Ketones, UA: NEGATIVE
Leukocytes, UA: NEGATIVE
NITRITE UA: NEGATIVE
PH UA: 6 (ref 5.0–8.0)
PROTEIN UA: NEGATIVE
RBC UA: NEGATIVE
SPEC GRAV UA: 1.015 (ref 1.010–1.025)
UROBILINOGEN UA: 0.2 U/dL

## 2016-07-02 LAB — LIPID PANEL
CHOLESTEROL: 192 mg/dL (ref <200)
HDL: 84 mg/dL (ref >50)
LDL Cholesterol: 88 mg/dL (ref <100)
TRIGLYCERIDES: 101 mg/dL (ref <150)
Total CHOL/HDL Ratio: 2.3 Ratio (ref <5.0)
VLDL: 20 mg/dL (ref <30)

## 2016-07-02 LAB — HEPATIC FUNCTION PANEL
ALT: 14 U/L (ref 6–29)
AST: 16 U/L (ref 10–30)
Albumin: 4.5 g/dL (ref 3.6–5.1)
Alkaline Phosphatase: 41 U/L (ref 33–115)
BILIRUBIN DIRECT: 0.1 mg/dL (ref <=0.2)
BILIRUBIN INDIRECT: 0.4 mg/dL (ref 0.2–1.2)
BILIRUBIN TOTAL: 0.5 mg/dL (ref 0.2–1.2)
Total Protein: 7.1 g/dL (ref 6.1–8.1)

## 2016-07-02 LAB — TSH: TSH: 0.54 mIU/L

## 2016-07-02 MED ORDER — CONCERTA 36 MG PO TBCR: 36.0000 mg | EXTENDED_RELEASE_TABLET | Freq: Every day | ORAL | 0 refills | Status: DC

## 2016-07-02 NOTE — Progress Notes (Signed)
   Subjective:    Patient ID: Traci Simpson, female    DOB: 28-Dec-1990, 26 y.o.   MRN: [id number]  HPI Here for a well exam and for lab work. 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 She feels fine and has no concerns. The school is requiring proof of her immunity to Hepatitis B and for non-infection with TB. Her ADHD as been well controlled with Concerta.   Review of Systems  Constitutional: Negative.   HENT: Negative.   Eyes: Negative.   Respiratory: Negative.   Cardiovascular: Negative.   Gastrointestinal: Negative.   Genitourinary: Negative for decreased urine volume, difficulty urinating, dyspareunia, dysuria, enuresis, flank pain, frequency, hematuria, pelvic pain and urgency.  Musculoskeletal: Negative.   Skin: Negative.   Neurological: Negative.   Psychiatric/Behavioral: Negative.        Objective:   Physical Exam  Constitutional: She is oriented to person, place, and time. She appears well-developed and well-nourished. No distress.  HENT:  Head: Normocephalic and atraumatic.  Right Ear: External ear normal.  Left Ear: External ear normal.  Nose: Nose normal.  Mouth/Throat: Oropharynx is clear and moist. No oropharyngeal exudate.  Eyes: Conjunctivae and EOM are normal. Pupils are equal, round, and reactive to light. No scleral icterus.  Neck: Normal range of motion. Neck supple. No JVD present. No thyromegaly present.  Cardiovascular: Normal rate, regular rhythm, normal heart sounds and intact distal pulses.  Exam reveals no gallop and no friction rub.   No murmur heard. Pulmonary/Chest: Effort normal and breath sounds normal. No respiratory distress. She has no wheezes. She has no rales. She exhibits no tenderness.  Abdominal: Soft. Bowel sounds are normal. She exhibits no distension and no mass. There is no tenderness. There is no rebound and no  guarding.  Musculoskeletal: Normal range of motion. She exhibits no edema or tenderness.  Lymphadenopathy:    She has no cervical adenopathy.  Neurological: She is alert and oriented to person, place, and time. She has normal reflexes. No cranial nerve deficit. She exhibits normal muscle tone. Coordination normal.  Skin: Skin is warm and dry. No rash noted. No erythema.  Psychiatric: She has a normal mood and affect. Her behavior is normal. Judgment and thought content normal.          Assessment & Plan:  Well exam. We discussed diet and exercise. Get fasting labs today including Hep G surface antigen IgG titer and Qunatiferon. Refilled Concerta. Gershon CraneStephen Tamaka Sawin, MD

## 2016-07-02 NOTE — Patient Instructions (Signed)
WE NOW OFFER   [facility] FAST TRACK!!!  SAME DAY Appointments for ACUTE CARE  Such as: Sprains, Injuries, cuts, abrasions, rashes, muscle pain, joint pain, back pain Colds, flu, sore throats, headache, allergies, cough, fever  Ear pain, sinus and eye infections Abdominal pain, nausea, vomiting, diarrhea, upset stomach Animal/insect bites  3 Easy Ways to Schedule: Walk-In Scheduling Call in scheduling Mychart Sign-up: [email or web address]         

## 2016-07-03 LAB — HEPATITIS B SURFACE ANTIBODY,QUALITATIVE: HEP B S AB: POSITIVE — AB

## 2016-07-05 LAB — QUANTIFERON TB GOLD ASSAY (BLOOD)
Interferon Gamma Release Assay: NEGATIVE
MITOGEN-NIL SO: 9.19 [IU]/mL
Quantiferon Nil Value: 0.04 IU/mL
Quantiferon Tb Ag Minus Nil Value: 0.06 IU/mL

## 2016-07-07 ENCOUNTER — Other Ambulatory Visit: Payer: Self-pay | Admitting: Family Medicine

## 2016-07-07 DIAGNOSIS — Z209 Contact with and (suspected) exposure to unspecified communicable disease: Secondary | ICD-10-CM

## 2016-07-07 LAB — HEPATITIS B SURFACE ANTIBODY, QUANTITATIVE: Hepatitis B-Post: 120 m[IU]/mL

## 2016-11-06 ENCOUNTER — Encounter: Payer: Self-pay | Admitting: Family Medicine

## 2016-11-12 ENCOUNTER — Other Ambulatory Visit: Payer: Self-pay | Admitting: Family Medicine

## 2016-11-12 MED ORDER — CONCERTA 36 MG PO TBCR: 36.0000 mg | EXTENDED_RELEASE_TABLET | Freq: Every day | ORAL | 0 refills | Status: DC

## 2016-11-12 NOTE — Telephone Encounter (Signed)
We will have her mother pick up the rx and then she can mail it to the patient

## 2016-11-13 NOTE — Telephone Encounter (Signed)
Script is ready for pick up here at front office and I spoke with pt.  

## 2016-11-14 ENCOUNTER — Other Ambulatory Visit: Payer: Self-pay | Admitting: Family Medicine

## 2016-11-14 MED ORDER — METHYLPHENIDATE HCL ER 36 MG PO TB24: 36.0000 mg | ORAL_TABLET | Freq: Every day | ORAL | 0 refills | Status: DC

## 2016-11-14 MED ORDER — METHYLPHENIDATE HCL ER 36 MG PO TB24
36.0000 mg | ORAL_TABLET | Freq: Every day | ORAL | 0 refills | Status: DC
Start: 2016-11-14 — End: 2016-11-14

## 2016-11-14 NOTE — Telephone Encounter (Signed)
Done. The other scripts will need to be shredded

## 2016-11-17 MED ORDER — METHYLPHENIDATE HCL ER 36 MG PO TB24
36.0000 mg | ORAL_TABLET | Freq: Every day | ORAL | 0 refills | Status: DC
Start: 2016-11-17 — End: 2017-02-05

## 2016-11-17 NOTE — Telephone Encounter (Signed)
She prefers to get a 90 day rx for the Concerta, so we will destroy the 30 day ones and write for a 90 day

## 2017-02-02 ENCOUNTER — Other Ambulatory Visit: Payer: Self-pay | Admitting: Family Medicine

## 2017-02-02 NOTE — Telephone Encounter (Signed)
Sent to PCP for approval.  

## 2017-02-05 ENCOUNTER — Other Ambulatory Visit: Payer: Self-pay | Admitting: Family Medicine

## 2017-02-05 NOTE — Telephone Encounter (Signed)
Pt following up on this request. Can we send this to her pharmacy? Pt will be home for Christmas and can pick up at local pharmacy.  [facility] [address]  methylphenidate 36 MG PO CR tablet

## 2017-02-05 NOTE — Telephone Encounter (Signed)
Sent to PCP ?

## 2017-02-06 MED ORDER — METHYLPHENIDATE HCL ER 36 MG PO TB24: 36.0000 mg | ORAL_TABLET | Freq: Every day | ORAL | 0 refills | Status: DC

## 2017-02-06 NOTE — Telephone Encounter (Signed)
Called and spoke to pt's mother. Mother advised Rx was placed up front for pick up.

## 2017-02-06 NOTE — Telephone Encounter (Signed)
Done

## 2017-02-06 NOTE — Telephone Encounter (Signed)
This was printed out 

## 2017-05-25 ENCOUNTER — Other Ambulatory Visit: Payer: Self-pay | Admitting: Family Medicine

## 2017-05-26 NOTE — Telephone Encounter (Signed)
Last OV 07/02/2016   Last refilled 02/06/2017 disp 90 with no refills   Sent to PCP for approval

## 2017-05-27 MED ORDER — METHYLPHENIDATE HCL ER 36 MG PO TB24: 36.0000 mg | ORAL_TABLET | Freq: Every day | ORAL | 0 refills | Status: AC

## 2017-05-27 NOTE — Telephone Encounter (Signed)
Ready to pick up.  

## 2018-06-18 IMAGING — CR DG CLAVICLE*L*
2 series · 2 of 2 positions shown · non-contrast
Comparison: Chest x-ray 07/01/2012

CLINICAL DATA: Left clavicle fracture.

EXAM:
LEFT CLAVICLE - 2+ VIEWS

[w clavicle ap left]
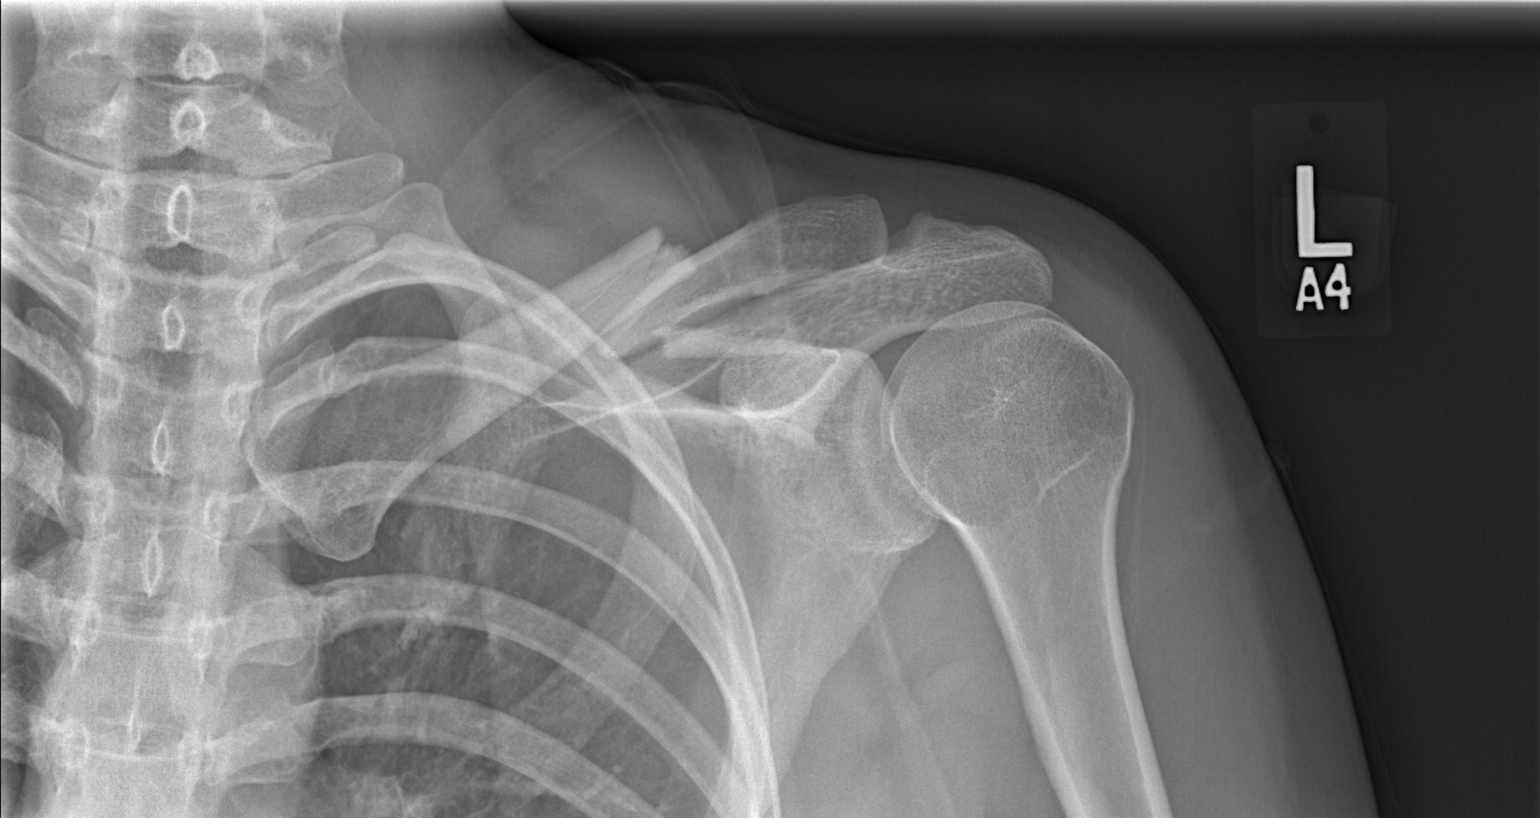

[w clavicle tangential left]
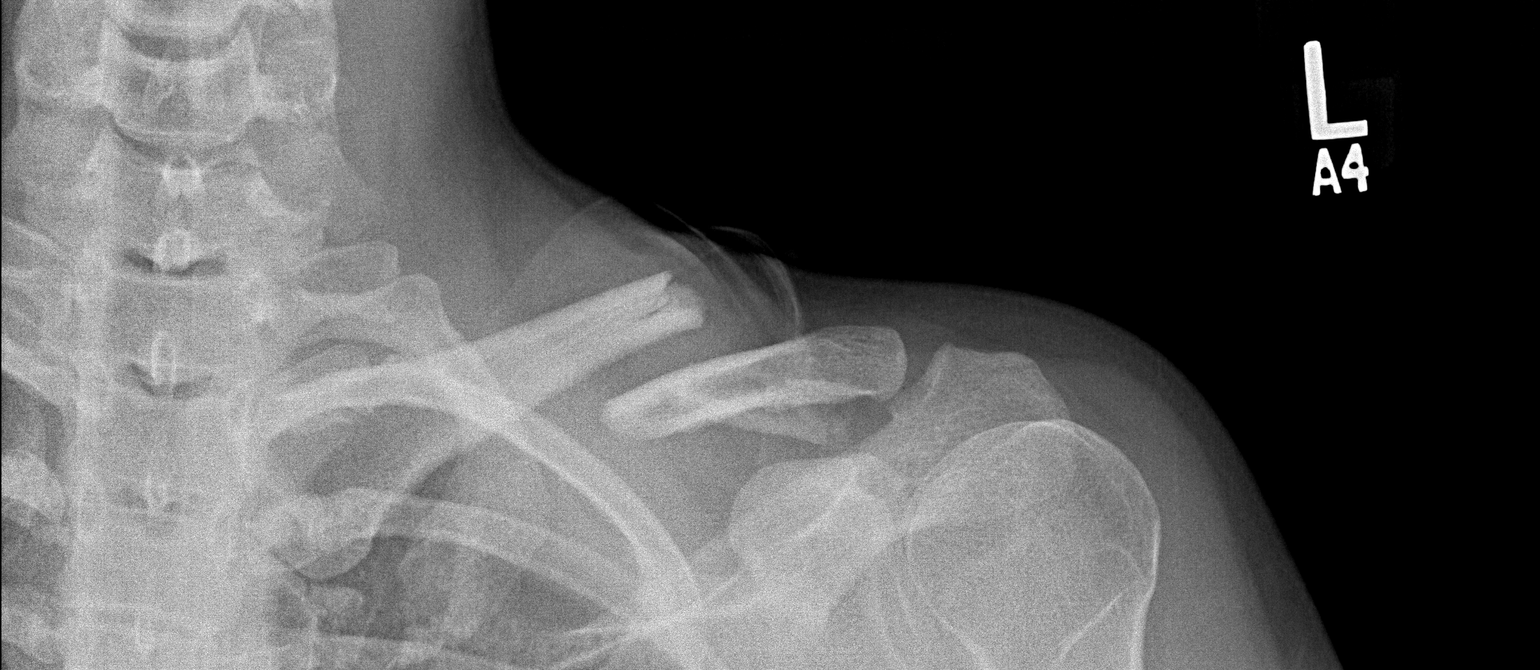

[2 of 2 positions shown; findings below may reference images not displayed]

FINDINGS: There is a comminuted fracture of the mid left clavicle. Overriding
of fracture fragments by approximately 2.5 cm. Left lung apex,
visualized portion of the left scapula, and visualized portion of
the left humerus appear intact.
IMPRESSION: Comminuted fracture of the left clavicle.

## 2020-09-11 ENCOUNTER — Encounter: Payer: Self-pay | Admitting: Dermatology

## 2020-09-11 ENCOUNTER — Ambulatory Visit: Payer: BC Managed Care – PPO | Admitting: Dermatology

## 2020-09-11 ENCOUNTER — Other Ambulatory Visit: Payer: Self-pay

## 2020-09-11 DIAGNOSIS — L409 Psoriasis, unspecified: Secondary | ICD-10-CM

## 2020-09-11 MED ORDER — CLOBETASOL PROP EMOLLIENT BASE 0.05 % EX CREA: TOPICAL_CREAM | CUTANEOUS | 11 refills | Status: AC

## 2020-09-11 MED ORDER — CLOBETASOL PROP EMOLLIENT BASE 0.05 % EX CREA: 1.0000 "application " | TOPICAL_CREAM | Freq: Every day | CUTANEOUS | 11 refills | Status: DC

## 2020-09-27 ENCOUNTER — Encounter: Payer: Self-pay | Admitting: Dermatology

## 2020-09-27 NOTE — Progress Notes (Signed)
   New Patient   Subjective  Traci Simpson is a 30 y.o. female who presents for the following: Psoriasis (Refill halobetasol flare on right forearm).  Psoriasis Location:  Duration:  Quality:  Associated Signs/Symptoms: Modifying Factors:  Severity:  Timing: Context:    The following portions of the chart were reviewed this encounter and updated as appropriate:      Objective  Well appearing patient in no apparent distress; mood and affect are within normal limits. Right Forearm - Anterior Localized psoriasiform patches.  No changes joints or nails. currently no involvement of scalp, ears.    A focused examination was performed including head, neck, back, arms, joints, nails.. Relevant physical exam findings are noted in the Assessment and Plan.   Assessment & Plan  Psoriasis Right Forearm - Anterior  With the autoimmune and systemic nature of psoriasis as well as essentially all treatment options are reviewed.  Emphasis on need for maintaining a healthy lifestyle.  May use topical clobetasol 1-2 times daily for up to 3 weeks for localized involvement; avoid use on face and body folds.  To contact me at that time with status update and decision on any changes for long-term therapy.
